# Patient Record
Sex: Male | Born: 1965 | Race: White | Hispanic: No | Marital: Married | Smoking: Former smoker
Health system: Southern US, Community
[De-identification: ages and names within clinical notes are randomized; demographics above are authoritative.]

## PROBLEM LIST (undated history)

## (undated) DIAGNOSIS — Z87442 Personal history of urinary calculi: Secondary | ICD-10-CM

## (undated) DIAGNOSIS — N200 Calculus of kidney: Secondary | ICD-10-CM

## (undated) DIAGNOSIS — J189 Pneumonia, unspecified organism: Secondary | ICD-10-CM

## (undated) DIAGNOSIS — I4891 Unspecified atrial fibrillation: Secondary | ICD-10-CM

## (undated) DIAGNOSIS — I499 Cardiac arrhythmia, unspecified: Secondary | ICD-10-CM

## (undated) DIAGNOSIS — G43909 Migraine, unspecified, not intractable, without status migrainosus: Secondary | ICD-10-CM

## (undated) DIAGNOSIS — I10 Essential (primary) hypertension: Secondary | ICD-10-CM

## (undated) DIAGNOSIS — I482 Chronic atrial fibrillation, unspecified: Secondary | ICD-10-CM

## (undated) DIAGNOSIS — Q249 Congenital malformation of heart, unspecified: Secondary | ICD-10-CM

## (undated) DIAGNOSIS — M1990 Unspecified osteoarthritis, unspecified site: Secondary | ICD-10-CM

## (undated) DIAGNOSIS — Z973 Presence of spectacles and contact lenses: Secondary | ICD-10-CM

## (undated) DIAGNOSIS — N3289 Other specified disorders of bladder: Secondary | ICD-10-CM

## (undated) HISTORY — DX: Congenital malformation of heart, unspecified: Q24.9

## (undated) HISTORY — DX: Unspecified atrial fibrillation: I48.91

## (undated) HISTORY — DX: Essential (primary) hypertension: I10

## (undated) HISTORY — DX: Chronic atrial fibrillation, unspecified: I48.20

## (undated) HISTORY — PX: TRANSTHORACIC ECHOCARDIOGRAM: SHX275

## (undated) HISTORY — DX: Migraine, unspecified, not intractable, without status migrainosus: G43.909

## (undated) HISTORY — DX: Calculus of kidney: N20.0

## (undated) HISTORY — PX: WISDOM TOOTH EXTRACTION: SHX21

## (undated) HISTORY — PX: CARDIAC SURGERY: SHX584

## (undated) HISTORY — DX: Other specified disorders of bladder: N32.89

---

## 2017-10-19 DIAGNOSIS — J189 Pneumonia, unspecified organism: Secondary | ICD-10-CM

## 2017-10-19 DIAGNOSIS — Z8701 Personal history of pneumonia (recurrent): Secondary | ICD-10-CM

## 2017-10-19 HISTORY — DX: Personal history of pneumonia (recurrent): Z87.01

## 2017-10-19 HISTORY — DX: Pneumonia, unspecified organism: J18.9

## 2018-03-19 HISTORY — PX: CARDIOVERSION: SHX1299

## 2018-09-16 DIAGNOSIS — R030 Elevated blood-pressure reading, without diagnosis of hypertension: Secondary | ICD-10-CM | POA: Diagnosis not present

## 2018-09-16 DIAGNOSIS — I4891 Unspecified atrial fibrillation: Secondary | ICD-10-CM | POA: Diagnosis not present

## 2018-09-16 DIAGNOSIS — Z760 Encounter for issue of repeat prescription: Secondary | ICD-10-CM | POA: Diagnosis not present

## 2018-09-18 HISTORY — PX: TRANSTHORACIC ECHOCARDIOGRAM: SHX275

## 2018-09-23 ENCOUNTER — Encounter

## 2018-09-23 ENCOUNTER — Ambulatory Visit: Payer: 59 | Admitting: Family Medicine

## 2018-09-23 ENCOUNTER — Encounter: Payer: Self-pay | Admitting: Family Medicine

## 2018-09-23 VITALS — BP 139/94 | HR 76 | Temp 98.1°F | Resp 16 | Ht 75.5 in | Wt 199.2 lb

## 2018-09-23 DIAGNOSIS — I4819 Other persistent atrial fibrillation: Secondary | ICD-10-CM

## 2018-09-23 MED ORDER — BISOPROLOL FUMARATE 10 MG PO TABS: 10.0000 mg | ORAL_TABLET | Freq: Every day | ORAL | 2 refills | Status: DC

## 2018-09-23 NOTE — Progress Notes (Signed)
Office Note 09/23/2018  CC:  Chief Complaint  Patient presents with  . Establish Care    Previous PCP: None in [place], [place] 3 months ago  . Atrial Fibrillation    wants referral to cardiologist    HPI:  Robert Braun is a 52 y.o. male who is here to establish care and discuss relatively recent dx of A-fib. Patient's most recent primary MD: in [place]. Old records were not reviewed prior to or during today's visit.  Dx'd with a-fib 11/2017 in the context of a pneumonia illness.   He had DC cardioversion 03/2018 but this did not hold.  He has some intermittent perception of irregular heartbeat.  No dizziness, no SOB, no CP. Symptoms seem to occur randomly and only for brief periods. He is not exercising much lately. No melena, hematochezia, nosebleeds, or excessive bleeding. He knows to avoid ASA/NSAIDs.  Past Medical History:  Diagnosis Date  . Atrial fibrillation ([place])   . Migraines     Past Surgical History:  Procedure Laterality Date  . CARDIOVERSION  03/2018    Family History  Problem Relation Age of Onset  . Parkinson's disease Mother   . Hypertension Father     Social History   Socioeconomic History  . Marital status: Married    Spouse name: Not on file  . Number of children: Not on file  . Years of education: Not on file  . Highest education level: Not on file  Occupational History  . Not on file  Social Needs  . Financial resource strain: Not on file  . Food insecurity:    Worry: Not on file    Inability: Not on file  . Transportation needs:    Medical: Not on file    Non-medical: Not on file  Tobacco Use  . Smoking status: Former Smoker    Types: Cigarettes    Last attempt to quit: 11/19/2013    Years since quitting: 4.8  . Smokeless tobacco: Never Used  Substance and Sexual Activity  . Alcohol use: Yes  . Drug use: Never  . Sexual activity: Not on file  Lifestyle  . Physical activity:    Days per week: Not on file   Minutes per session: Not on file  . Stress: Not on file  Relationships  . Social connections:    Talks on phone: Not on file    Gets together: Not on file    Attends religious service: Not on file    Active member of club or organization: Not on file    Attends meetings of clubs or organizations: Not on file    Relationship status: Not on file  . Intimate partner violence:    Fear of current or ex partner: Not on file    Emotionally abused: Not on file    Physically abused: Not on file    Forced sexual activity: Not on file  Other Topics Concern  . Not on file  Social History Narrative   Married, 1 daughter.   Orig from [place].   Lived in [place] 20 yrs, then moved to [facility] 04/2018.   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   Smoker until 2015-->switched to vap.   Alcohol: some beer and wine several days per week--sometimes >3 glasses of wine.   No drugs.    Outpatient Encounter Medications as of 09/23/2018  Medication Sig  . ELIQUIS 5 MG TABS tablet Take 1 tablet by mouth 2 (  two) times daily.  . Multiple Vitamin (MULTIVITAMIN) tablet Take 1 tablet by mouth daily.  . [DISCONTINUED] bisoprolol (ZEBETA) 5 MG tablet Take 5 mg by mouth daily.  . bisoprolol (ZEBETA) 10 MG tablet Take 1 tablet (10 mg total) by mouth daily.   No facility-administered encounter medications on file as of 09/23/2018.     No Known Allergies  ROS Review of Systems  Constitutional: Negative for appetite change, chills, fatigue and fever.  HENT: Negative for congestion, dental problem, ear pain and sore throat.   Eyes: Negative for discharge, redness and visual disturbance.  Respiratory: Negative for cough, chest tightness, shortness of breath and wheezing.   Cardiovascular: Positive for palpitations (occasional-->see hpi). Negative for chest pain and leg swelling.  Gastrointestinal: Negative for abdominal pain, blood in stool, diarrhea, nausea and vomiting.  Genitourinary: Negative for difficulty  urinating, dysuria, flank pain, frequency, hematuria and urgency.  Musculoskeletal: Negative for arthralgias, back pain, joint swelling, myalgias and neck stiffness.  Skin: Negative for pallor and rash.  Neurological: Negative for dizziness, speech difficulty, weakness and headaches.  Hematological: Negative for adenopathy. Does not bruise/bleed easily.  Psychiatric/Behavioral: Negative for confusion and sleep disturbance. The patient is not nervous/anxious.     PE; Blood pressure (!) 139/94, pulse 76, temperature 98.1 F (36.7 C), temperature source Oral, resp. rate 16, height 6' 3.5" (1.918 m), weight 199 lb 4 oz (90.4 kg), SpO2 99 %. Gen: Alert, well appearing.  Patient is oriented to person, place, time, and situation. AFFECT: pleasant, lucid thought and speech. OTL:XBWI: no injection, icteris, swelling, or exudate.  EOMI, PERRLA. Mouth: lips without lesion/swelling.  Oral mucosa pink and moist. Oropharynx without erythema, exudate, or swelling.  CV: irreg irreg, rate approx 203-559, with 2/6 systolic murmur heard best at apex, without rub.   LUNGS: CTA bilat, nonlabored resps, good aeration in all lung fields. EXT: no clubbing or cyanosis.  no edema.   Pertinent labs:  none  ASSESSMENT AND PLAN:   New pt: no old records at this time.  A-fib, persistent.  Currently has mild tachycardia, mild bp elevation, and is intermittently with mild/brief palpitations/racing heart sensation. Increase bisoprolol to 10mg  qd. Continue eliquis 5mg  bid. Referral to cardiologist ordered. Pt states that he has some records from his cardiologist in the [place] and he'll find them and let us get a copy and he'll also bring a copy to the cardiologist.  An After Visit Summary was printed and given to the patient.  Return in about 6 months (around 03/25/2019) for annual CPE (fasting).  Signed:  Crissie Sickles, MD           09/23/2018

## 2018-09-30 ENCOUNTER — Ambulatory Visit: Payer: 59 | Admitting: Cardiology

## 2018-10-04 ENCOUNTER — Encounter: Payer: Self-pay | Admitting: Cardiovascular Disease

## 2018-10-04 ENCOUNTER — Ambulatory Visit: Payer: 59 | Admitting: Cardiovascular Disease

## 2018-10-04 VITALS — BP 130/85 | HR 105 | Ht 75.59 in | Wt 202.0 lb

## 2018-10-04 DIAGNOSIS — Q249 Congenital malformation of heart, unspecified: Secondary | ICD-10-CM | POA: Insufficient documentation

## 2018-10-04 DIAGNOSIS — I4819 Other persistent atrial fibrillation: Secondary | ICD-10-CM

## 2018-10-04 NOTE — Assessment & Plan Note (Signed)
Robert Braun was referred to me by Dr. Ernestine Conrad to be established in my practice and for consultation regarding atrial fibrillation.  He was diagnosed with A. fib after bronchitis in January of this year.  He underwent successful cardioversion in June and went back in A. fib on 05/08/2018.  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  He is on Eliquis oral anticoagulation and Zebeta for rate control.  He does admit to alcohol utilization several days a week.  I am going to refer him to the A. fib clinic for further evaluation.  He may benefit from repeat cardioversion and/or consideration of A. fib ablation which may be complicated by his Congenital heart disease.

## 2018-10-04 NOTE — Patient Instructions (Signed)
Medication Instructions:  NONE If you need a refill on your cardiac medications before your next appointment, please call your pharmacy.   Lab work: NONE If you have labs (blood work) drawn today and your tests are completely normal, you will receive your results only by: Marland Kitchen MyChart Message (if you have MyChart) OR . A paper copy in the mail If you have any lab test that is abnormal or we need to change your treatment, we will call you to review the results.  Testing/Procedures: Your physician has requested that you have an echocardiogram. Echocardiography is a painless test that uses sound waves to create images of your heart. It provides your doctor with information about the size and shape of your heart and how well your heart's chambers and valves are working. This procedure takes approximately one hour. There are no restrictions for this procedure.   Follow-Up: At [facility], you and your health needs are our priority.  As part of our continuing mission to provide you with exceptional heart care, we have created designated Provider Care Teams.  These Care Teams include your primary Cardiologist (physician) and Advanced Practice Providers (APPs -  Physician Assistants and Nurse Practitioners) who all work together to provide you with the care you need, when you need it. You will need a follow up appointment in 6 months.  Please call our office 2 months in advance to schedule this appointment.  You may see DR. BERRY or one of the following Advanced Practice Providers on your designated Care Team:   Kerin Ransom, PA-C [place], [place] . Sande Rives, PA-C  Any Other Special Instructions Will Be Listed Below (If Applicable). REFERRAL TO [facility]

## 2018-10-04 NOTE — Progress Notes (Signed)
10/04/2018 Robert Braun   Jan 02, 1966  [id number]  Primary Physician McGowen, Adrian Blackwater, MD Primary Cardiologist: Lorretta Harp MD Lupe Carney, [place]  HPI:  Robert Braun is a 52 y.o. fit appearing married Caucasian male father of 1 daughter who is referred by Dr. Ernestine Conrad for cardiovascular evaluation because of persistent atrial fibrillation.  He recently relocated from the [place] to [facility] 4 months ago because of his wife's job.  She is the Biomedical engineer at [employer].  His cardiac risk factor profile is notable for 25-pack-year tobacco abuse having quit 5 years ago.  Otherwise he has no family history.  He is never had a heart attack or stroke.  Denies chest pain or shortness of breath.  He was diagnosed with A. fib after a bout of bronchitis in January of this year.  He was cardioverted in June successfully went back in A. fib on 05/08/2018.  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  He does feel that he is in A. fib but is otherwise asymptomatic from this.   Current Meds  Medication Sig  . bisoprolol (ZEBETA) 10 MG tablet Take 1 tablet (10 mg total) by mouth daily.  Marland Kitchen ELIQUIS 5 MG TABS tablet Take 1 tablet by mouth 2 (two) times daily.  . Multiple Vitamin (MULTIVITAMIN) tablet Take 1 tablet by mouth daily.     No Known Allergies  Social History   Socioeconomic History  . Marital status: Married    Spouse name: Not on file  . Number of children: Not on file  . Years of education: Not on file  . Highest education level: Not on file  Occupational History  . Not on file  Social Needs  . Financial resource strain: Not on file  . Food insecurity:    Worry: Not on file    Inability: Not on file  . Transportation needs:    Medical: Not on file    Non-medical: Not on file  Tobacco Use  .  Smoking status: Former Smoker    Types: Cigarettes    Last attempt to quit: 11/19/2013    Years since quitting: 4.8  . Smokeless tobacco: Never Used  Substance and Sexual Activity  . Alcohol use: Yes  . Drug use: Never  . Sexual activity: Not on file  Lifestyle  . Physical activity:    Days per week: Not on file    Minutes per session: Not on file  . Stress: Not on file  Relationships  . Social connections:    Talks on phone: Not on file    Gets together: Not on file    Attends religious service: Not on file    Active member of club or organization: Not on file    Attends meetings of clubs or organizations: Not on file    Relationship status: Not on file  . Intimate partner violence:    Fear of current or ex partner: Not on file    Emotionally abused: Not on file    Physically abused: Not on file    Forced sexual activity: Not on file  Other Topics Concern  . Not on file  Social History Narrative   Married, 1 daughter.   Orig from [place].   Lived in [place] 20 yrs, then moved to [facility] 04/2018.   Occup: Homemaker.  Wife is  VP of a company.   Smoker until 2015-->switched to vap.   Alcohol: some beer and wine several days per week--sometimes >3 glasses of wine.   No drugs.     Review of Systems: General: negative for chills, fever, night sweats or weight changes.  Cardiovascular: negative for chest pain, dyspnea on exertion, edema, orthopnea, palpitations, paroxysmal nocturnal dyspnea or shortness of breath Dermatological: negative for rash Respiratory: negative for cough or wheezing Urologic: negative for hematuria Abdominal: negative for nausea, vomiting, diarrhea, bright red blood per rectum, melena, or hematemesis Neurologic: negative for visual changes, syncope, or dizziness All other systems reviewed and are otherwise negative except as noted above.    Blood pressure 130/85, pulse (!) 105, height 6' 3.59" (1.92 m), weight 202 lb (91.6 kg).  General appearance:  alert and no distress Neck: no adenopathy, no carotid bruit, no JVD, supple, symmetrical, trachea midline and thyroid not enlarged, symmetric, no tenderness/mass/nodules Lungs: clear to auscultation bilaterally Heart: regular rate and rhythm, S1, S2 normal, no murmur, click, rub or gallop Extremities: extremities normal, atraumatic, no cyanosis or edema Pulses: 2+ and symmetric Skin: Skin color, texture, turgor normal. No rashes or lesions Neurologic: Alert and oriented X 3, normal strength and tone. Normal symmetric reflexes. Normal coordination and gait  EKG atrial fibrillation with nonspecific IVCD, rapid ventricular response and lateral T wave inversion.  I personally reviewed this EKG.  ASSESSMENT AND PLAN:   Persistent atrial fibrillation Mr Linquist was referred to me by Dr. Ernestine Conrad to be established in my practice and for consultation regarding atrial fibrillation.  He was diagnosed with A. fib after bronchitis in January of this year.  He underwent successful cardioversion in June and went back in A. fib on 05/08/2018.  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  He is on Eliquis oral anticoagulation and Zebeta for rate control.  He does admit to alcohol utilization several days a week.  I am going to refer him to the A. fib clinic for further evaluation.  He may benefit from repeat cardioversion and/or consideration of A. fib ablation which may be complicated by his Congenital heart disease.      Lorretta Harp MD FACP,FACC,FAHA, [facility] 10/04/2018 11:18 AM

## 2018-10-09 ENCOUNTER — Encounter: Payer: Self-pay | Admitting: Family Medicine

## 2018-10-10 ENCOUNTER — Telehealth: Payer: Self-pay | Admitting: Family Medicine

## 2018-10-10 NOTE — Telephone Encounter (Signed)
Patient requesting refills Bisoprolol Fumarate and Eliquis to be [facility].

## 2018-10-10 NOTE — Telephone Encounter (Signed)
Rx for bisoprolol was sent on 09/23/18 for #30 w/ 2RF.   We did not send Rx for Eliquis, new pt to office.   Please advise. Thanks.

## 2018-10-11 ENCOUNTER — Ambulatory Visit (HOSPITAL_COMMUNITY): Payer: 59 | Attending: Cardiovascular Disease

## 2018-10-11 DIAGNOSIS — I4819 Other persistent atrial fibrillation: Secondary | ICD-10-CM | POA: Diagnosis not present

## 2018-10-12 ENCOUNTER — Encounter: Payer: Self-pay | Admitting: Family Medicine

## 2018-10-12 MED ORDER — ELIQUIS 5 MG PO TABS: 5.0000 mg | ORAL_TABLET | Freq: Two times a day (BID) | ORAL | 1 refills | Status: DC

## 2018-10-12 NOTE — Telephone Encounter (Signed)
Eliquis eRx'd.

## 2018-10-18 ENCOUNTER — Ambulatory Visit: Payer: 59 | Admitting: Family Medicine

## 2018-10-18 ENCOUNTER — Ambulatory Visit (INDEPENDENT_AMBULATORY_CARE_PROVIDER_SITE_OTHER): Payer: 59

## 2018-10-18 ENCOUNTER — Encounter: Payer: Self-pay | Admitting: Family Medicine

## 2018-10-18 ENCOUNTER — Other Ambulatory Visit (INDEPENDENT_AMBULATORY_CARE_PROVIDER_SITE_OTHER): Payer: 59

## 2018-10-18 VITALS — BP 134/79 | HR 94 | Temp 97.5°F | Resp 16 | Ht 75.59 in | Wt 196.4 lb

## 2018-10-18 DIAGNOSIS — R1031 Right lower quadrant pain: Secondary | ICD-10-CM

## 2018-10-18 DIAGNOSIS — M545 Low back pain, unspecified: Secondary | ICD-10-CM

## 2018-10-18 DIAGNOSIS — R1030 Lower abdominal pain, unspecified: Secondary | ICD-10-CM | POA: Diagnosis not present

## 2018-10-18 DIAGNOSIS — R5383 Other fatigue: Secondary | ICD-10-CM | POA: Diagnosis not present

## 2018-10-18 DIAGNOSIS — R110 Nausea: Secondary | ICD-10-CM | POA: Diagnosis not present

## 2018-10-18 LAB — POCT URINALYSIS DIPSTICK
Bilirubin, UA: NEGATIVE
Blood, UA: NEGATIVE
Glucose, UA: NEGATIVE
Ketones, UA: NEGATIVE
LEUKOCYTES UA: NEGATIVE
NITRITE UA: NEGATIVE
PROTEIN UA: NEGATIVE
SPEC GRAV UA: 1.01 (ref 1.010–1.025)
Urobilinogen, UA: 0.2 E.U./dL
pH, UA: 6 (ref 5.0–8.0)

## 2018-10-18 LAB — CBC WITH DIFFERENTIAL/PLATELET
Basophils Absolute: 0.1 10*3/uL (ref 0.0–0.1)
Basophils Relative: 1 % (ref 0.0–3.0)
EOS PCT: 3.9 % (ref 0.0–5.0)
Eosinophils Absolute: 0.2 10*3/uL (ref 0.0–0.7)
HCT: 43.6 % (ref 39.0–52.0)
Hemoglobin: 14.5 g/dL (ref 13.0–17.0)
Lymphocytes Relative: 37.3 % (ref 12.0–46.0)
Lymphs Abs: 2.3 10*3/uL (ref 0.7–4.0)
MCHC: 33.2 g/dL (ref 30.0–36.0)
MCV: 96.3 fl (ref 78.0–100.0)
MONO ABS: 0.8 10*3/uL (ref 0.1–1.0)
Monocytes Relative: 12.4 % — ABNORMAL HIGH (ref 3.0–12.0)
Neutro Abs: 2.8 10*3/uL (ref 1.4–7.7)
Neutrophils Relative %: 45.4 % (ref 43.0–77.0)
Platelets: 210 10*3/uL (ref 150.0–400.0)
RBC: 4.52 Mil/uL (ref 4.22–5.81)
RDW: 13.3 % (ref 11.5–15.5)
WBC: 6.2 10*3/uL (ref 4.0–10.5)

## 2018-10-18 LAB — COMPREHENSIVE METABOLIC PANEL
ALT: 17 U/L (ref 0–53)
AST: 11 U/L (ref 0–37)
Albumin: 4.3 g/dL (ref 3.5–5.2)
Alkaline Phosphatase: 33 U/L — ABNORMAL LOW (ref 39–117)
BUN: 12 mg/dL (ref 6–23)
CO2: 30 mEq/L (ref 19–32)
Calcium: 9.6 mg/dL (ref 8.4–10.5)
Chloride: 102 mEq/L (ref 96–112)
Creatinine, Ser: 0.88 mg/dL (ref 0.40–1.50)
GFR: 96.76 mL/min (ref >60.00)
Glucose, Bld: 93 mg/dL (ref 70–99)
Potassium: 4.9 mEq/L (ref 3.5–5.1)
Sodium: 138 mEq/L (ref 135–145)
Total Bilirubin: 0.5 mg/dL (ref 0.2–1.2)
Total Protein: 6.1 g/dL (ref 6.0–8.3)

## 2018-10-18 LAB — TSH: TSH: 0.7 u[IU]/mL (ref 0.35–4.50)

## 2018-10-18 LAB — LIPASE: Lipase: 35 U/L (ref 11.0–59.0)

## 2018-10-18 LAB — SEDIMENTATION RATE: Sed Rate: 1 mm/hr (ref 0–20)

## 2018-10-18 NOTE — Patient Instructions (Signed)
Low Back Sprain Rehab  Ask your health care provider which exercises are safe for you. Do exercises exactly as told by your health care provider and adjust them as directed. It is normal to feel mild stretching, pulling, tightness, or discomfort as you do these exercises, but you should stop right away if you feel sudden pain or your pain gets worse. Do not begin these exercises until told by your health care provider.  Stretching and range of motion exercises  These exercises warm up your muscles and joints and improve the movement and flexibility of your back. These exercises also help to relieve pain, numbness, and tingling.  Exercise A: Lumbar rotation    1. Lie on your back on a firm surface and bend your knees.  2. Straighten your arms out to your sides so each arm forms an "L" shape with a side of your body (a 90 degree angle).  3. Slowly move both of your knees to one side of your body until you feel a stretch in your lower back. Try not to let your shoulders move off of the floor.  4. Hold for __________ seconds.  5. Tense your abdominal muscles and slowly move your knees back to the starting position.  6. Repeat this exercise on the other side of your body.  Repeat __________ times. Complete this exercise __________ times a day.  Exercise B: Prone extension on elbows    1. Lie on your abdomen on a firm surface.  2. Prop yourself up on your elbows.  3. Use your arms to help lift your chest up until you feel a gentle stretch in your abdomen and your lower back.  ? This will place some of your body weight on your elbows. If this is uncomfortable, try stacking pillows under your chest.  ? Your hips should stay down, against the surface that you are lying on. Keep your hip and back muscles relaxed.  4. Hold for __________ seconds.  5. Slowly relax your upper body and return to the starting position.  Repeat __________ times. Complete this exercise __________ times a day.  Strengthening exercises  These  exercises build strength and endurance in your back. Endurance is the ability to use your muscles for a long time, even after they get tired.  Exercise C: Pelvic tilt  1. Lie on your back on a firm surface. Bend your knees and keep your feet flat.  2. Tense your abdominal muscles. Tip your pelvis up toward the ceiling and flatten your lower back into the floor.  ? To help with this exercise, you may place a small towel under your lower back and try to push your back into the towel.  3. Hold for __________ seconds.  4. Let your muscles relax completely before you repeat this exercise.  Repeat __________ times. Complete this exercise __________ times a day.  Exercise D: Alternating arm and leg raises    1. Get on your hands and knees on a firm surface. If you are on a hard floor, you may want to use padding to cushion your knees, such as an exercise mat.  2. Line up your arms and legs. Your hands should be below your shoulders, and your knees should be below your hips.  3. Lift your left leg behind you. At the same time, raise your right arm and straighten it in front of you.  ? Do not lift your leg higher than your hip.  ? Do not lift your arm   higher than your shoulder.  ? Keep your abdominal and back muscles tight.  ? Keep your hips facing the ground.  ? Do not arch your back.  ? Keep your balance carefully, and do not hold your breath.  4. Hold for __________ seconds.  5. Slowly return to the starting position and repeat with your right leg and your left arm.  Repeat __________ times. Complete this exercise __________ times a day.  Exercise E: Abdominal set with straight leg raise    1. Lie on your back on a firm surface.  2. Bend one of your knees and keep your other leg straight.  3. Tense your abdominal muscles and lift your straight leg up, 4-6 inches (10-15 cm) off the ground.  4. Keep your abdominal muscles tight and hold for __________ seconds.  ? Do not hold your breath.  ? Do not arch your back. Keep it  flat against the ground.  5. Keep your abdominal muscles tense as you slowly lower your leg back to the starting position.  6. Repeat with your other leg.  Repeat __________ times. Complete this exercise __________ times a day.  Posture and body mechanics    Body mechanics refers to the movements and positions of your body while you do your daily activities. Posture is part of body mechanics. Good posture and healthy body mechanics can help to relieve stress in your body's tissues and joints. Good posture means that your spine is in its natural S-curve position (your spine is neutral), your shoulders are pulled back slightly, and your head is not tipped forward. The following are general guidelines for applying improved posture and body mechanics to your everyday activities.  Standing    · When standing, keep your spine neutral and your feet about hip-width apart. Keep a slight bend in your knees. Your ears, shoulders, and hips should line up.  · When you do a task in which you stand in one place for a long time, place one foot up on a stable object that is 2-4 inches (5-10 cm) high, such as a footstool. This helps keep your spine neutral.  Sitting    · When sitting, keep your spine neutral and keep your feet flat on the floor. Use a footrest, if necessary, and keep your thighs parallel to the floor. Avoid rounding your shoulders, and avoid tilting your head forward.  · When working at a desk or a computer, keep your desk at a height where your hands are slightly lower than your elbows. Slide your chair under your desk so you are close enough to maintain good posture.  · When working at a computer, place your monitor at a height where you are looking straight ahead and you do not have to tilt your head forward or downward to look at the screen.  Resting    · When lying down and resting, avoid positions that are most painful for you.  · If you have pain with activities such as sitting, bending, stooping, or squatting  (flexion-based activities), lie in a position in which your body does not bend very much. For example, avoid curling up on your side with your arms and knees near your chest (fetal position).  · If you have pain with activities such as standing for a long time or reaching with your arms (extension-based activities), lie with your spine in a neutral position and bend your knees slightly. Try the following positions:  · Lying on your side with a   pillow between your knees.  · Lying on your back with a pillow under your knees.  Lifting    · When lifting objects, keep your feet at least shoulder-width apart and tighten your abdominal muscles.  · Bend your knees and hips and keep your spine neutral. It is important to lift using the strength of your legs, not your back. Do not lock your knees straight out.  · Always ask for help to lift heavy or awkward objects.  This information is not intended to replace advice given to you by your health care provider. Make sure you discuss any questions you have with your health care provider.  Document Released: 10/05/2005 Document Revised: 06/11/2016 Document Reviewed: 07/17/2015  [employer]vier Interactive Patient Education © 2019 [employer]

## 2018-10-18 NOTE — Progress Notes (Signed)
OFFICE VISIT  10/18/2018   CC:  Chief Complaint  Patient presents with  . Back Pain    lower back pain, also felt like he had low grade fever  . Abdominal Pain    lower   HPI:    Patient is a 52 y.o. Caucasian male who presents for low back pain. Onset 2 mo ago, intermittent LBP bilat, about every other day, varies a lot in intensity but never severe.  No radiation of the pain.  Pain is not altered by position but then he says he feels like it is more stiff when bending forward and back up again.  No correlation with excessive activity in the prior day or two.  No hx of preceding injury.  No hx of malignancy or immunosuppression. He takes no med for the pain.  Generally doesn't feel well during these times, ? Feeling feverish-->pt vague, admits he is unable to be very clear in his description of how he feels.   Also, at separate times (not same time as back) he has some burning/sharp pain in R LQ and rad across lower abd and into R groin.  No urine c/o.  No worsening or relief of the pain with urinating.  No gross hematuria, urinary urgency or incomplete emptying.  Slight nauseated feeling but no vomiting.  No diarrhea or constipation.   No clear evidence of abnl wt loss.  Appetite has been a little bit down but says he is taking in about the same amount of calories as normal.    Past Medical History:  Diagnosis Date  . Atrial fibrillation ([place])    09/2018: Dr. Gwenlyn Found referred him to a-fib clinic for consideration or redo CV or ablation.  . 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  Marland Kitchen Migraines     Past Surgical History:  Procedure Laterality Date  . CARDIAC SURGERY     correction of transposition of the great arteries noted on cardiac MRI done in the [place].  Marland Kitchen CARDIOVERSION  03/2018   pt only briefly held sinus rhythm  . TRANSTHORACIC [place]CHOCARDIOGRAM     [place]-->[place] any results.  . TRANSTHORACIC ECHOCARDIOGRAM  09/2018   EF for both ventricles 25%, severe systemic AV (tricuspid) regurg--> pt needs gated CT angio and [facility].    Outpatient Medications Prior to Visit  Medication Sig Dispense Refill  . bisoprolol (ZEBETA) 10 MG tablet Take 1 tablet (10 mg total) by mouth daily. 30 tablet 2  . ELIQUIS 5 MG TABS tablet Take 1 tablet (5 mg total) by mouth 2 (two) times daily. 180 tablet 1  . Multiple Vitamin (MULTIVITAMIN) tablet Take 1 tablet by mouth daily.     No facility-administered medications prior to visit.     No Known Allergies  ROS As per HPI  PE: Blood pressure 134/79, pulse 94, temperature (!) 97.5 F (36.4 C), temperature source Oral, resp. rate 16, height 6' 3.59" (1.92 m), weight 196 lb 6 oz (89.1 kg), SpO2 100 %. Gen: Alert, well appearing.  Patient is oriented to person, place, time, and situation. AFFECT: pleasant, lucid thought and speech.  He is mildly anxious. Cervical, thoracic and lumbar spine exam is normal without tenderness, masses or kyphoscoliosis. Full range of motion without pain is noted.  Some stiffness noted by pt when going from full flexion of L spine  to upright position.  Supine SLR neg bilat.   CV: RRR, no m/r/g.   LUNGS: CTA bilat, nonlabored resps, good aeration in all lung fields. ABD: soft, NT, ND, BS normal.  No hepatospenomegaly or mass.  No bruits. EXT: no clubbing or cyanosis.  no edema.     LABS:    Chemistry      Component Value Date/Time   NA 138 10/18/2018 0959   K 4.9 10/18/2018 0959   CL 102 10/18/2018 0959   CO2 30 10/18/2018 0959   BUN 12 10/18/2018 0959   CREATININE 0.88 10/18/2018 0959      Component Value Date/Time   CALCIUM 9.6 10/18/2018 0959   ALKPHOS 33 (L) 10/18/2018 0959   AST 11 10/18/2018 0959   ALT 17 10/18/2018 0959   BILITOT 0.5 10/18/2018 0959     Lab Results  Component Value Date   WBC 6.2 10/18/2018   HGB 14.5  10/18/2018   HCT 43.6 10/18/2018   MCV 96.3 10/18/2018   PLT 210.0 10/18/2018   Lab Results  Component Value Date   TSH 0.70 10/18/2018    IMPRESSION AND PLAN:  1) Low back pain; musculoskeletal suspected. With pt's poor ability to describe sx's combined with his significant anxiety surrounding the way he feels, I will check plain film of L/S spine.  2) Intermittent RLQ pain, rad to R groin.  Mild nonspecific fatigue x ?months. Normal abd exam.  He drank at least 5 bottles of water (> 1 hr) and he still could not produce a urine sample for analysis here today.  We sent him to [facility] [place] office to get his x-ray and the staff there said they would take care of getting a urine specimen as well. Will check CBC w/diff, CMET, lipase, ESR, TSH, and hemoccults x 3 as well.  An After Visit Summary was printed and given to the patient.  FOLLOW UP: Return in about 2 weeks (around 11/01/2018) for f/u LBP and RLQ abd pains.  Signed:  Crissie Sickles, MD           10/18/2018

## 2018-10-19 DIAGNOSIS — R109 Unspecified abdominal pain: Secondary | ICD-10-CM

## 2018-10-19 HISTORY — DX: Unspecified abdominal pain: R10.9

## 2018-10-19 LAB — URINE CULTURE
MICRO NUMBER: 91556944
Result:: NO GROWTH
SPECIMEN QUALITY:: ADEQUATE

## 2018-10-20 ENCOUNTER — Encounter: Payer: Self-pay | Admitting: *Deleted

## 2018-10-26 ENCOUNTER — Ambulatory Visit (HOSPITAL_COMMUNITY)
Admission: RE | Admit: 2018-10-26 | Discharge: 2018-10-26 | Disposition: A | Discharge status: Home / Self Care | Payer: 59 | Source: Ambulatory Visit | Attending: Nurse Practitioner | Admitting: Nurse Practitioner

## 2018-10-26 ENCOUNTER — Encounter (HOSPITAL_COMMUNITY): Payer: Self-pay | Admitting: Nurse Practitioner

## 2018-10-26 VITALS — BP 144/86 | HR 127 | Ht 75.59 in | Wt 196.0 lb

## 2018-10-26 DIAGNOSIS — Z8701 Personal history of pneumonia (recurrent): Secondary | ICD-10-CM | POA: Insufficient documentation

## 2018-10-26 DIAGNOSIS — I4819 Other persistent atrial fibrillation: Secondary | ICD-10-CM

## 2018-10-26 DIAGNOSIS — Z7901 Long term (current) use of anticoagulants: Secondary | ICD-10-CM | POA: Diagnosis not present

## 2018-10-26 DIAGNOSIS — Z79899 Other long term (current) drug therapy: Secondary | ICD-10-CM | POA: Diagnosis not present

## 2018-10-26 DIAGNOSIS — Z8249 Family history of ischemic heart disease and other diseases of the circulatory system: Secondary | ICD-10-CM | POA: Insufficient documentation

## 2018-10-26 DIAGNOSIS — Q249 Congenital malformation of heart, unspecified: Secondary | ICD-10-CM | POA: Diagnosis not present

## 2018-10-26 DIAGNOSIS — Z87891 Personal history of nicotine dependence: Secondary | ICD-10-CM | POA: Diagnosis not present

## 2018-10-27 NOTE — Progress Notes (Addendum)
Primary Care Physician: Tammi Sou, MD Referring Physician: Dr. Gwenlyn Found Cardiologist: Dr. [name] is a 53 y.o. male with a h/o persistent afib, transposition of the great arteries(  discovered  2019 in the [place]), recently moved here form the [place], as his wife was given a job as Mudlogger of IT  with [employer]. He is [place] form [place].  He gives a story of developing Pneumonia in JAN-FEB of 2019. At that time new onset afib was noted. He was started on anticoagulation and had a cardioversion in June which was successful. It  held one month. He was then seen by a general  cardiologist and an Adult Congential Heart Disease specialist in the [place].. They both suggested restoring sinus rhythm, the cardiologist stated that he would probably require amiodarone to maintain SR as he has severely dilated left atrium. The congential specialist wanted additional testing when SR was restored. Their plans were limited with pt planning to move to the [place]. He did arrive in the fall but it took time to establish with a PCP and then referral to cardiology and then to the afib clinic.He is in afib with rvr, v rates in the 120's and is tolerating well. Has a remote history of tobacco abuse/ more recently vaping and alcohol use. 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   Today, he denies symptoms of palpitations, chest pain, shortness of breath, orthopnea, PND, lower extremity edema, dizziness, presyncope, syncope, or neurologic sequela. The patient is tolerating medications without difficulties and is otherwise without complaint today.   Past Medical History:  Diagnosis Date  . Atrial fibrillation ([place])    09/2018: Dr. Gwenlyn Found referred him to a-fib clinic for consideration or redo CV or ablation.  . 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  Marland Kitchen Migraines    Past Surgical History:  Procedure  Laterality Date  . CARDIAC SURGERY     correction of transposition of the great arteries noted on cardiac MRI done in the [place].  Marland Kitchen CARDIOVERSION  03/2018   pt only briefly held sinus rhythm  . TRANSTHORACIC ECHOCARDIOGRAM     [place]-->pt cannot recall any results.  . TRANSTHORACIC ECHOCARDIOGRAM  09/2018   EF for both ventricles 25%, severe systemic AV (tricuspid) regurg--> pt needs gated CT angio and cardiac [facility].    Current Outpatient Medications  Medication Sig Dispense Refill  . bisoprolol (ZEBETA) 10 MG tablet Take 1 tablet (10 mg total) by mouth daily. 30 tablet 2  . ELIQUIS 5 MG TABS tablet Take 1 tablet (5 mg total) by mouth 2 (two) times daily. 180 tablet 1  . Multiple Vitamin (MULTIVITAMIN) tablet Take 1 tablet by mouth daily.     No current facility-administered medications for this encounter.     No Known Allergies  Social History   Socioeconomic History  . Marital status: Married    Spouse name: Not on file  . Number of children: Not on file  . Years of education: Not on file  . Highest education level: Not on file  Occupational History  . Not on file  Social Needs  . Financial resource strain: Not on file  . Food insecurity:    Worry: Not on file    Inability: Not on file  . Transportation needs:    Medical: Not on file    Non-medical: Not  on file  Tobacco Use  . Smoking status: Former Smoker    Types: Cigarettes    Last attempt to quit: 11/19/2013    Years since quitting: 4.9  . Smokeless tobacco: Never Used  Substance and Sexual Activity  . Alcohol use: Yes  . Drug use: Never  . Sexual activity: Not on file  Lifestyle  . Physical activity:    Days per week: Not on file    Minutes per session: Not on file  . Stress: Not on file  Relationships  . Social connections:    Talks on phone: Not on file    Gets together: Not on file    Attends religious service: Not on file    Active member of club or organization: Not on file     Attends meetings of clubs or organizations: Not on file    Relationship status: Not on file  . Intimate partner violence:    Fear of current or ex partner: Not on file    Emotionally abused: Not on file    Physically abused: Not on file    Forced sexual activity: Not on file  Other Topics Concern  . Not on file  Social History Narrative   Married, 1 daughter.   Orig from [place].   Lived in [place] 20 yrs, then moved to [place] 04/2018.   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   Smoker until 2015-->switched to vap.   Alcohol: some beer and wine several days per week--sometimes >3 glasses of wine.   No drugs.    Family History  Problem Relation Age of Onset  . Parkinson's disease Mother   . Hypertension Father     ROS- All systems are reviewed and negative except as per the HPI above  Physical Exam: Vitals:   10/26/18 1409  BP: (!) 144/86  Pulse: (!) 127  Weight: 88.9 kg  Height: 6' 3.59" (1.92 m)   Wt Readings from Last 3 Encounters:  10/26/18 88.9 kg  10/18/18 89.1 kg  10/04/18 91.6 kg    Labs: Lab Results  Component Value Date   NA 138 10/18/2018   K 4.9 10/18/2018   CL 102 10/18/2018   CO2 30 10/18/2018   GLUCOSE 93 10/18/2018   BUN 12 10/18/2018   CREATININE 0.88 10/18/2018   CALCIUM 9.6 10/18/2018   No results found for: INR No results found for: CHOL, HDL, LDLCALC, TRIG   GEN- The patient is well appearing, alert and oriented x 3 today.   Head- normocephalic, atraumatic Eyes-  Sclera clear, conjunctiva pink Ears- hearing intact Oropharynx- clear Neck- supple, no JVP Lymph- no cervical lymphadenopathy Lungs- Clear to ausculation bilaterally, normal work of breathing Heart- Regular rate and rhythm, soft systolic  murmurs, rubs or gallops, PMI not laterally displaced GI- soft, NT, ND, + BS Extremities- no clubbing, cyanosis, or edema MS- no significant deformity or atrophy Skin- no rash or lesion Psych- euthymic mood, full affect Neuro-  strength and sensation are intact  EKG-afib at 127 bpm, LVH, qrs int 126 ms, qtc 336 ms Pt's notes form cardiology,Adult congential Specialist, EKG, Cardaic MRI, all from [place] reviewed    Assessment and Plan: 1. 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 Rayann Heman in consideration with his congenital abnormalities if this is the best option  Discussed with pt and he will check on the price of the drug I do not think he is an ablation candidate at this time, having persisitent afib for a year and severely dilated left atrium as well as the congenital issues He may need referral on to an Adult congenital specialist  once the above afib issues are addressed He will continue with Eliquis 5 mg bid for a chadsvasc score of 1( lv dysfunction)  Addendum- 10/31/2018- Discussed with Dr. Rayann Heman and he reviewed records from  the [place] and feels he would be best for him to be evaluated by Adult Congenital Specialist, and EP at [facility]  on the same day if could be arranged, instead of trying to restore SR here. Will request appointment at [facility]. Pt made aware.   Geroge Baseman Eria Lozoya, [facility] [address] [place], [place] [address] [phone]

## 2018-10-31 ENCOUNTER — Telehealth (HOSPITAL_COMMUNITY): Payer: Self-pay | Admitting: *Deleted

## 2018-10-31 NOTE — Addendum Note (Signed)
Encounter addended by: Sherran Needs, NP on: 10/31/2018 9:49 AM  Actions taken: Clinical Note Signed

## 2018-10-31 NOTE — Telephone Encounter (Signed)
Referral placed to [facility] cardiology - adult congenital specialist and EP.

## 2018-11-06 ENCOUNTER — Encounter: Payer: Self-pay | Admitting: Family Medicine

## 2018-11-08 ENCOUNTER — Encounter (HOSPITAL_COMMUNITY): Payer: Self-pay

## 2018-11-15 ENCOUNTER — Other Ambulatory Visit: Payer: Self-pay | Admitting: *Deleted

## 2018-11-15 MED ORDER — BISOPROLOL FUMARATE 10 MG PO TABS: 10.0000 mg | ORAL_TABLET | Freq: Every day | ORAL | 1 refills | Status: DC

## 2018-11-15 NOTE — Telephone Encounter (Signed)
Received fax from pharmacy requesting 90 day supply per pts insurance.  Rx sent for #90 w/ 1RF.

## 2018-11-21 DIAGNOSIS — Z8774 Personal history of (corrected) congenital malformations of heart and circulatory system: Secondary | ICD-10-CM | POA: Insufficient documentation

## 2018-11-21 DIAGNOSIS — Z8669 Personal history of other diseases of the nervous system and sense organs: Secondary | ICD-10-CM | POA: Insufficient documentation

## 2018-11-22 DIAGNOSIS — Z8669 Personal history of other diseases of the nervous system and sense organs: Secondary | ICD-10-CM | POA: Diagnosis not present

## 2018-11-22 DIAGNOSIS — Q203 Discordant ventriculoarterial connection: Secondary | ICD-10-CM | POA: Diagnosis not present

## 2018-11-22 DIAGNOSIS — Q228 Other congenital malformations of tricuspid valve: Secondary | ICD-10-CM | POA: Insufficient documentation

## 2018-11-22 DIAGNOSIS — I4819 Other persistent atrial fibrillation: Secondary | ICD-10-CM | POA: Diagnosis not present

## 2018-11-25 DIAGNOSIS — I429 Cardiomyopathy, unspecified: Secondary | ICD-10-CM | POA: Insufficient documentation

## 2018-12-04 ENCOUNTER — Encounter: Payer: Self-pay | Admitting: Family Medicine

## 2018-12-23 ENCOUNTER — Ambulatory Visit: Payer: 59 | Admitting: Family Medicine

## 2018-12-23 ENCOUNTER — Encounter: Payer: Self-pay | Admitting: Family Medicine

## 2018-12-23 DIAGNOSIS — R1031 Right lower quadrant pain: Secondary | ICD-10-CM | POA: Diagnosis not present

## 2018-12-23 NOTE — Progress Notes (Signed)
OFFICE VISIT  12/23/2018   CC:  Chief Complaint  Patient presents with  . Follow-up    RLQ abd pain   HPI:    Patient is a 53 y.o.  male who presents for 2 mo f/u LBP and RLQ abd pains. Last visit I checked a general lab panel, did UA, and did L spine x-rays-->all of these tests were normal. No meds were rx'd at that time.   Interim Hx: His low back pain has resolved.  RLQ pain:  After I saw him last he was essentially symptom-free for about 1 month, then in the last week the RLQ abd pain has returned and has been persistent.  It is focal in mid RLQ, sometimes extending down into R groin a bit, sometimes across lower abd to midline or so.  Mild/nagging level of pain that stays the same all the time.  No nausea or change in appetite.  No known trigger and no alleviating factor.  No constipation or diarrhea.  No melena or hematochezia.  No dysuria, urinary frequency, urgency, or gross hematuria.  No urinary hesitancy or decrease in force of stream.  No testicular/scrotal pain. Denies any recent abd strain. He takes not meds for this pain.    No fever, no night sweats.  Past Medical History:  Diagnosis Date  . Atrial fibrillation ([place])    09/2018: Dr. Gwenlyn Found referred him to a-fib clinic for consideration or redo CV or ablation. After eval by a-fib clinic, he was referred to [facility] to adult congenital specialist and EP: amiodarone started, cMRI to be repeated.  . 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  Also Systemic RV EF 41%, Mild systemic AR and mild/mod systemic AV valve regurg.  Normal fxn of subpulmonic LV.  Marland Kitchen Migraines     Past Surgical History:  Procedure Laterality Date  . CARDIAC SURGERY     correction of transposition of the great arteries noted on cardiac MRI done in the [place].  Marland Kitchen CARDIOVERSION  03/2018   pt only briefly held sinus rhythm  .  TRANSTHORACIC [place]OCARDIOGRAM     [place] any results.  . TRANSTHORACIC ECHOCARDIOGRAM  09/2018   EF for both ventricles 25%, severe systemic AV (tricuspid) regurg--> pt needs gated CT angio and cardiac MRI [facility].    Outpatient Medications Prior to Visit  Medication Sig Dispense Refill  . amiodarone (PACERONE) 400 MG tablet TAKE 1 TABLET BY MOUTH TWICE DAILY FOR 4 WEEKS THEN 1/2 TABLET DAILY THEREAFTER.    . bisoprolol (ZEBETA) 10 MG tablet Take 1 tablet (10 mg total) by mouth daily. 90 tablet 1  . ELIQUIS 5 MG TABS tablet Take 1 tablet (5 mg total) by mouth 2 (two) times daily. 180 tablet 1  . lisinopril (PRINIVIL,ZESTRIL) 10 MG tablet Take by mouth.    . Multiple Vitamin (MULTIVITAMIN) tablet Take 1 tablet by mouth daily.     No facility-administered medications prior to visit.     No Known Allergies  ROS As per HPI  PE: Blood pressure 139/90, pulse 90, temperature 97.9 F (36.6 C), temperature source Oral, resp. rate 16, height 6' 3.59" (1.92 m), weight 197 lb 6.4 oz (89.5 kg), SpO2 99 %. Gen: Alert, well appearing.  Patient is oriented to person, place, time, and situation. AFFECT: pleasant, lucid thought and speech. ZOX:WRUE: no injection, icteris, swelling, or  exudate.  EOMI, PERRLA. Mouth: lips without lesion/swelling.  Oral mucosa pink and moist. Oropharynx without erythema, exudate, or swelling.  CV: RRR, 4/6 systolic murmur, S1 and S2 obscured, no diastolic murmur.  No r/g.   LUNGS: CTA bilat, nonlabored resps, good aeration in all lung fields. ABD: soft, NT, ND, BS normal.  No hepatospenomegaly or mass.  No bruits. EXT: no clubbing or cyanosis.  no edema.    LABS:    Chemistry      Component Value Date/Time   NA 138 10/18/2018 0959   K 4.9 10/18/2018 0959   CL 102 10/18/2018 0959   CO2 30 10/18/2018 0959   BUN 12 10/18/2018 0959   CREATININE 0.88 10/18/2018 0959      Component Value Date/Time   CALCIUM 9.6 10/18/2018 0959    ALKPHOS 33 (L) 10/18/2018 0959   AST 11 10/18/2018 0959   ALT 17 10/18/2018 0959   BILITOT 0.5 10/18/2018 0959      IMPRESSION AND PLAN:  Abdominal pain, unknown etiology. CBC, CMET, ESR, CRP, TSH, lipase, and UA all normal at initial visit for this 2 mo ago. Will obtain CT abd/pelv with contrast for further evaluation. Pt expressed understanding and agreement with the plan. No meds rx'd at this time.  Will repeat BMET in prep for contrast.  An After Visit Summary was printed and given to the patient.  FOLLOW UP: To be determined based on results of pending workup.  Signed:  Crissie Sickles, MD           12/23/2018

## 2018-12-24 LAB — BASIC METABOLIC PANEL
BUN: 14 mg/dL (ref 7–25)
CO2: 29 mmol/L (ref 20–32)
Calcium: 9.9 mg/dL (ref 8.6–10.3)
Chloride: 97 mmol/L — ABNORMAL LOW (ref 98–110)
Creat: 0.98 mg/dL (ref 0.70–1.33)
Glucose, Bld: 74 mg/dL (ref 65–99)
Potassium: 5.3 mmol/L (ref 3.5–5.3)
SODIUM: 135 mmol/L (ref 135–146)

## 2018-12-28 ENCOUNTER — Other Ambulatory Visit: Payer: Self-pay

## 2018-12-28 ENCOUNTER — Ambulatory Visit
Admission: RE | Admit: 2018-12-28 | Discharge: 2018-12-28 | Disposition: A | Discharge status: Home / Self Care | Payer: 59 | Source: Ambulatory Visit | Attending: Family Medicine | Admitting: Family Medicine

## 2018-12-28 DIAGNOSIS — K4090 Unilateral inguinal hernia, without obstruction or gangrene, not specified as recurrent: Secondary | ICD-10-CM | POA: Diagnosis not present

## 2018-12-28 DIAGNOSIS — R1031 Right lower quadrant pain: Secondary | ICD-10-CM

## 2018-12-28 MED ORDER — IOPAMIDOL (ISOVUE-300) INJECTION 61%
100.0000 mL | Freq: Once | INTRAVENOUS | Status: AC | PRN
  Administered 2018-12-28: 100 mL via INTRAVENOUS

## 2018-12-29 ENCOUNTER — Encounter (HOSPITAL_COMMUNITY): Payer: Self-pay

## 2018-12-30 ENCOUNTER — Other Ambulatory Visit (HOSPITAL_COMMUNITY): Payer: Self-pay | Admitting: *Deleted

## 2019-01-09 DIAGNOSIS — I4891 Unspecified atrial fibrillation: Secondary | ICD-10-CM | POA: Diagnosis not present

## 2019-01-11 ENCOUNTER — Encounter (HOSPITAL_COMMUNITY): Payer: Self-pay | Admitting: Nurse Practitioner

## 2019-01-11 ENCOUNTER — Encounter (HOSPITAL_COMMUNITY): Payer: Self-pay | Admitting: *Deleted

## 2019-01-11 ENCOUNTER — Other Ambulatory Visit: Payer: Self-pay

## 2019-01-11 ENCOUNTER — Ambulatory Visit (HOSPITAL_COMMUNITY)
Admission: RE | Admit: 2019-01-11 | Discharge: 2019-01-11 | Disposition: A | Discharge status: Home / Self Care | Payer: 59 | Source: Ambulatory Visit | Attending: Nurse Practitioner | Admitting: Nurse Practitioner

## 2019-01-11 VITALS — BP 138/82 | HR 87 | Ht 75.75 in | Wt 197.3 lb

## 2019-01-11 DIAGNOSIS — Z7901 Long term (current) use of anticoagulants: Secondary | ICD-10-CM | POA: Insufficient documentation

## 2019-01-11 DIAGNOSIS — I4819 Other persistent atrial fibrillation: Secondary | ICD-10-CM | POA: Insufficient documentation

## 2019-01-11 DIAGNOSIS — Z87891 Personal history of nicotine dependence: Secondary | ICD-10-CM | POA: Diagnosis not present

## 2019-01-11 DIAGNOSIS — Z79899 Other long term (current) drug therapy: Secondary | ICD-10-CM | POA: Diagnosis not present

## 2019-01-11 LAB — BASIC METABOLIC PANEL
Anion gap: 10 (ref 5–15)
BUN: 11 mg/dL (ref 6–20)
CO2: 27 mmol/L (ref 22–32)
Calcium: 9.1 mg/dL (ref 8.9–10.3)
Chloride: 100 mmol/L (ref 98–111)
Creatinine, Ser: 0.94 mg/dL (ref 0.61–1.24)
GFR calc Af Amer: >60 mL/min (ref >60)
GFR calc non Af Amer: >60 mL/min (ref >60)
Glucose, Bld: 82 mg/dL (ref 70–99)
Potassium: 4.5 mmol/L (ref 3.5–5.1)
SODIUM: 137 mmol/L (ref 135–145)

## 2019-01-11 LAB — CBC
HCT: 42.9 % (ref 39.0–52.0)
Hemoglobin: 14.4 g/dL (ref 13.0–17.0)
MCH: 32.1 pg (ref 26.0–34.0)
MCHC: 33.6 g/dL (ref 30.0–36.0)
MCV: 95.5 fL (ref 80.0–100.0)
Platelets: 259 10*3/uL (ref 150–400)
RBC: 4.49 MIL/uL (ref 4.22–5.81)
RDW: 12.4 % (ref 11.5–15.5)
WBC: 6.7 10*3/uL (ref 4.0–10.5)
nRBC: 0 % (ref 0.0–0.2)

## 2019-01-11 NOTE — Patient Instructions (Signed)
Cardioversion scheduled for Thursday, March 26th  - Arrive at the [facility] and go to admitting at 10:30am  -Do not eat or drink anything after midnight the night prior to your procedure.  - Take all your morning medications with a sip of water prior to arrival.  - You will not be able to drive home after your procedure.

## 2019-01-11 NOTE — Progress Notes (Signed)
Spoke with patient regarding his instructions for DOS.  Patient denies SOB, chest pain, fever, cough, congestions, n/v.  Patient was instructed to stop now all vitamin, supplements, Ibuprofen, motrin, aleve, fish oil, and stop vaping until after the procedure.    PCP - [place] Cardiologist - [place]  Chest x-ray - denies EKG - 01/11/2019 Stress Test - denies ECHO - 10/11/2018 Cardiac Cath - denies  Sleep Study - denies CPAP - n/a  Per Dr Sallyanne Kuster, patient to arrive at 1030, nothing to eat or drink after midnight tonight, sip of water in the morning (01/12/19) to take amiodarone, bisoprolol and eliquis.  Patient informed of hospital visitation restriction policy and understands that his wife Robert Braun will be his ride home upon discharge from hospital.    Patient states the neither he nor his wife Robert Braun have had any of the following symptoms:  Cough yes/no: No Fever (>100.63F)  yes/no: No Runny nose yes/no: No Sore throat yes/no: No Difficulty breathing/shortness of breath  yes/no: No Congestions  No Muscle aches  No  Have you or your wife Robert Braun traveled in the last 14 days? yes/no: No   Patient verbalized understanding of all pre-op instructions.

## 2019-01-11 NOTE — Progress Notes (Signed)
Yes, thanks!  I've already been through his chart and agree that this should not be delayed any further MCr

## 2019-01-11 NOTE — Progress Notes (Signed)
Primary Care Physician: Tammi Sou, MD Referring Physician: Dr. Gwenlyn Found Cardiologist: Dr. [name] is a 53 y.o. male with a h/o persistent afib, transposition of the great arteries(  discovered  2019 in the [place]), recently moved here form the [place], as his wife was given a job as Mudlogger of IT  with [employer]. He is [place]. He was initially seen in the afib clinic January 2020.  He gave a story of developing Pneumonia in JAN-FEB of 2019. At that time new onset afib was noted. He was started on anticoagulation and had a cardioversion in June which was successful. It  held one month. He was then seen by a general  cardiologist and an Adult Congential Heart Disease specialist in the [place]. They both suggested restoring sinus rhythm, the cardiologist stated that he would probably require amiodarone to maintain SR as he has severely dilated left atrium. The congential specialist wanted additional testing when SR was restored. Their plans were limited with pt planning to move to the [place]. He did arrive in the fall but it took time to establish with a PCP and then referral to cardiology and then to the afib clinic.He was in afib with rvr, v rates in the 120's and was tolerating well. Has a remote history of tobacco abuse/ more recently vaping and alcohol use.   F/u in afib clinic, 01/11/19. After I saw him I discussed with Dr. Rayann Heman and he felt that he should be referred to EP and Adult Congential specialist at [facility]. He was seen there for consult 11/22/2018. The note I have is of the EP visit with Dr. Marcello Moores. He felt the best approach was to load with amiodarone with subsequent cardioversion after loading. His office called after that appointment and asked for the cardioversion to be set up here for the convenience of the pt. He was loaded on 400 mg bid for 4 weeks and is now on 200 mg daily. He has tolerated this well. Ekg shows today afib at 87 bpm. He has not missed any doses of  anticoagulation. He will have cardiac MRI at [facility] in the spring after hopefully restoring SR per Dr. Marcello Moores office note. Weight  is stable.  Today, he denies symptoms of palpitations, chest pain, shortness of breath, orthopnea, PND, lower extremity edema, dizziness, presyncope, syncope, or neurologic sequela. The patient is tolerating medications without difficulties and is otherwise without complaint today.   Past Medical History:  Diagnosis Date  . Atrial fibrillation ([place])    09/2018: Dr. Gwenlyn Found referred him to a-fib clinic for consideration or redo CV or ablation. After eval by a-fib clinic, he was referred to [facility] to adult congenital specialist and EP: amiodarone started, cMRI to be repeated.  . 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  Also Systemic RV EF 41%, Mild systemic AR and mild/mod systemic AV valve regurg.  Normal fxn of subpulmonic LV.  Marland Kitchen Migraines    Past Surgical History:  Procedure Laterality Date  . CARDIAC SURGERY     correction of transposition of the great arteries noted on cardiac MRI done in the [place].  Marland Kitchen CARDIOVERSION  03/2018   pt only briefly held sinus rhythm  . TRANSTHORACIC [place]CHOCARDIOGRAM     [place]pt [place] any results.  . TRANSTHORACIC ECHOCARDIOGRAM  09/2018   EF for both ventricles 25%,  severe systemic AV (tricuspid) regurg--> pt needs gated CT angio [facility].    Current Outpatient Medications  Medication Sig Dispense Refill  . amiodarone (PACERONE) 400 MG tablet Take 200 mg by mouth daily.     . bisoprolol (ZEBETA) 10 MG tablet Take 1 tablet (10 mg total) by mouth daily. 90 tablet 1  . ELIQUIS 5 MG TABS tablet Take 1 tablet (5 mg total) by mouth 2 (two) times daily. 180 tablet 1  . lisinopril (PRINIVIL,ZESTRIL) 10 MG tablet Take 10 mg by mouth daily at 3 pm.      No current  facility-administered medications for this encounter.     No Known Allergies  Social History   Socioeconomic History  . Marital status: Married    Spouse name: Not on file  . Number of children: Not on file  . Years of education: Not on file  . Highest education level: Not on file  Occupational History  . Not on file  Social Needs  . Financial resource strain: Not on file  . Food insecurity:    Worry: Not on file    Inability: Not on file  . Transportation needs:    Medical: Not on file    Non-medical: Not on file  Tobacco Use  . Smoking status: Former Smoker    Types: Cigarettes    Last attempt to quit: 11/19/2013    Years since quitting: 5.1  . Smokeless tobacco: Never Used  Substance and Sexual Activity  . Alcohol use: Yes  . Drug use: Never  . Sexual activity: Not on file  Lifestyle  . Physical activity:    Days per week: Not on file    Minutes per session: Not on file  . Stress: Not on file  Relationships  . Social connections:    Talks on phone: Not on file    Gets together: Not on file    Attends religious service: Not on file    Active member of club or organization: Not on file    Attends meetings of clubs or organizations: Not on file    Relationship status: Not on file  . Intimate partner violence:    Fear of current or ex partner: Not on file    Emotionally abused: Not on file    Physically abused: Not on file    Forced sexual activity: Not on file  Other Topics Concern  . Not on file  Social History Narrative   Married, 1 daughter.   Orig from [place].   Lived in [place] 20 yrs, then moved to [facility] 04/2018.   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   Smoker until 2015-->switched to vap.   Alcohol: some beer and wine several days per week--sometimes >3 glasses of wine.   No drugs.    Family History  Problem Relation Age of Onset  . Parkinson's disease Mother   . Hypertension Father     ROS- All systems are reviewed and negative except  as per the HPI above  Physical Exam: Vitals:   01/11/19 1353  Weight: 89.5 kg  Height: 6' 3.75" (1.924 m)   Wt Readings from Last 3 Encounters:  01/11/19 89.5 kg  12/23/18 89.5 kg  10/26/18 88.9 kg    Labs: Lab Results  Component Value Date   NA 135 12/23/2018   K 5.3 12/23/2018   CL 97 (L) 12/23/2018   CO2 29 12/23/2018   GLUCOSE 74 12/23/2018  BUN 14 12/23/2018   CREATININE 0.98 12/23/2018   CALCIUM 9.9 12/23/2018   No results found for: INR No results found for: CHOL, HDL, LDLCALC, TRIG   GEN- The patient is well appearing, alert and oriented x 3 today.   Head- normocephalic, atraumatic Eyes-  Sclera clear, conjunctiva pink Ears- hearing intact Oropharynx- clear Neck- supple, no JVP Lymph- no cervical lymphadenopathy Lungs- Clear to ausculation bilaterally, normal work of breathing Heart- irregular rate and rhythm, soft systolic  murmurs, rubs or gallops, PMI not laterally displaced GI- soft, NT, ND, + BS Extremities- no clubbing, cyanosis, or edema MS- no significant deformity or atrophy Skin- no rash or lesion Psych- euthymic mood, full affect Neuro- strength and sensation are intact  EKG-afib at 87 bpm, LVH, qrs int 146 ms, qtc 495 ms Pt's notes form cardiology, Adult congential Specialist, EKG, Cardaic MRI, all previously from [place] reviewed Office note from Dr. Donne Anon, 11/22/18, reviewed    Assessment and Plan: 1. 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) He has been in persistent afib, for one year,  since January  of last year,  following successful cardioversion in June 2019,and then return to afib since He has severely dilated Left atrium so probably will not maintain SR without an antiarrythmic, it was suggested to use amiodarone in the [place] but was moving to the [place] and did not follow with plans there He was seen at [facility] by EP and Adult  Congenital Specialty 11/22/18, Has been loading on amiodarone since then and is now ready for cardioversion Risk vrs benefit explained to pt Bmet/cbc today  He will continue with Eliquis 5 mg bid for a chadsvasc score of 1  (lv dysfunction) States no missed does for the last 3 weeks  F/u in afib clinic 4/2. Plans are in place for him to f/u at [facility] in the late Spring  Ailie Gage C. Sherrian Nunnelley, [facility] [address] [place], [place] [address] [phone]

## 2019-01-11 NOTE — H&P (View-Only) (Signed)
Primary Care Physician: Tammi Sou, MD Referring Physician: Dr. Gwenlyn Found Cardiologist: Dr. Pryor Curia is a 53 y.o. male with a h/o persistent afib, transposition of the great arteries(  discovered  2019 in the [place]), recently moved here form the [place], as his wife was given a job as Mudlogger of IT  with [employer]. [place] is [place]. He was initially seen in the afib clinic January 2020.  He gave a story of developing Pneumonia in JAN-FEB of 2019. At that time new onset afib was noted. He was started on anticoagulation and had a cardioversion in June which was successful. It  held one month. He was then seen by a general  cardiologist and an Adult Congential Heart Disease specialist in the [place]. They both suggested restoring sinus rhythm, the cardiologist stated that he would probably require amiodarone to maintain SR as he has severely dilated left atrium. The congential specialist wanted additional testing when SR was restored. Their plans were limited with pt planning to move to the [place]. He did arrive in the fall but it took time to establish with a PCP and then referral to cardiology and then to the afib clinic.He was in afib with rvr, v rates in the 120's and was tolerating well. Has a remote history of tobacco abuse/ more recently vaping and alcohol use.   F/u in afib clinic, 01/11/19. After I saw him I discussed with Dr. Rayann Heman and he felt that he should be referred to EP and Adult Congential specialist at [facility]. He was seen there for consult 11/22/2018. The note I have is of the EP visit with Dr. Marcello Moores. He felt the best approach was to load with amiodarone with subsequent cardioversion after loading. His office called after that appointment and asked for the cardioversion to be set up here for the convenience of the pt. He was loaded on 400 mg bid for 4 weeks and is now on 200 mg daily. He has tolerated this well. Ekg shows today afib at 87 bpm. He has not missed any doses of  anticoagulation. He will have cardiac MRI at [facility] in the spring after hopefully restoring SR per Dr. Marcello Moores office note. Weight  is stable.  Today, he denies symptoms of palpitations, chest pain, shortness of breath, orthopnea, PND, lower extremity edema, dizziness, presyncope, syncope, or neurologic sequela. The patient is tolerating medications without difficulties and is otherwise without complaint today.   Past Medical History:  Diagnosis Date  . Atrial fibrillation ([place])    09/2018: Dr. Gwenlyn Found referred him to a-fib clinic for consideration or redo CV or ablation. After eval by a-fib clinic, he was referred to [facility] to adult congenital specialist and EP: amiodarone started, cMRI to be repeated.  . 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  Also Systemic RV EF 41%, Mild systemic AR and mild/mod systemic AV valve regurg.  Normal fxn of subpulmonic LV.  Marland Kitchen Migraines    Past Surgical History:  Procedure Laterality Date  . CARDIAC SURGERY     correction of transposition of the great arteries noted on cardiac MRI done in the [place].  Marland Kitchen CARDIOVERSION  03/2018   pt only briefly held sinus rhythm  . TRANSTHORACIC [place]CHOCARDIOGRAM     [place] any results.  . TRANSTHORACIC ECHOCARDIOGRAM  09/2018   EF for both ventricles 25%,  severe systemic AV (tricuspid) regurg--> pt needs gated CT angio and [facility].    Current Outpatient Medications  Medication Sig Dispense Refill  . amiodarone (PACERONE) 400 MG tablet Take 200 mg by mouth daily.     . bisoprolol (ZEBETA) 10 MG tablet Take 1 tablet (10 mg total) by mouth daily. 90 tablet 1  . ELIQUIS 5 MG TABS tablet Take 1 tablet (5 mg total) by mouth 2 (two) times daily. 180 tablet 1  . lisinopril (PRINIVIL,ZESTRIL) 10 MG tablet Take 10 mg by mouth daily at 3 pm.      No current  facility-administered medications for this encounter.     No Known Allergies  Social History   Socioeconomic History  . Marital status: Married    Spouse name: Not on file  . Number of children: Not on file  . Years of education: Not on file  . Highest education level: Not on file  Occupational History  . Not on file  Social Needs  . Financial resource strain: Not on file  . Food insecurity:    Worry: Not on file    Inability: Not on file  . Transportation needs:    Medical: Not on file    Non-medical: Not on file  Tobacco Use  . Smoking status: Former Smoker    Types: Cigarettes    Last attempt to quit: 11/19/2013    Years since quitting: 5.1  . Smokeless tobacco: Never Used  Substance and Sexual Activity  . Alcohol use: Yes  . Drug use: Never  . Sexual activity: Not on file  Lifestyle  . Physical activity:    Days per week: Not on file    Minutes per session: Not on file  . Stress: Not on file  Relationships  . Social connections:    Talks on phone: Not on file    Gets together: Not on file    Attends religious service: Not on file    Active member of club or organization: Not on file    Attends meetings of clubs or organizations: Not on file    Relationship status: Not on file  . Intimate partner violence:    Fear of current or ex partner: Not on file    Emotionally abused: Not on file    Physically abused: Not on file    Forced sexual activity: Not on file  Other Topics Concern  . Not on file  Social History Narrative   Married, 1 daughter.   Orig from [place].   Lived in [place] 20 yrs, then moved to [facility] 04/2018.   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   Smoker until 2015-->switched to vap.   Alcohol: some beer and wine several days per week--sometimes >3 glasses of wine.   No drugs.    Family History  Problem Relation Age of Onset  . Parkinson's disease Mother   . Hypertension Father     ROS- All systems are reviewed and negative except  as per the HPI above  Physical Exam: Vitals:   01/11/19 1353  Weight: 89.5 kg  Height: 6' 3.75" (1.924 m)   Wt Readings from Last 3 Encounters:  01/11/19 89.5 kg  12/23/18 89.5 kg  10/26/18 88.9 kg    Labs: Lab Results  Component Value Date   NA 135 12/23/2018   K 5.3 12/23/2018   CL 97 (L) 12/23/2018   CO2 29 12/23/2018   GLUCOSE 74 12/23/2018  BUN 14 12/23/2018   CREATININE 0.98 12/23/2018   CALCIUM 9.9 12/23/2018   No results found for: INR No results found for: CHOL, HDL, LDLCALC, TRIG   GEN- The patient is well appearing, alert and oriented x 3 today.   Head- normocephalic, atraumatic Eyes-  Sclera clear, conjunctiva pink Ears- hearing intact Oropharynx- clear Neck- supple, no JVP Lymph- no cervical lymphadenopathy Lungs- Clear to ausculation bilaterally, normal work of breathing Heart- irregular rate and rhythm, soft systolic  murmurs, rubs or gallops, PMI not laterally displaced GI- soft, NT, ND, + BS Extremities- no clubbing, cyanosis, or edema MS- no significant deformity or atrophy Skin- no rash or lesion Psych- euthymic mood, full affect Neuro- strength and sensation are intact  EKG-afib at 87 bpm, LVH, qrs int 146 ms, qtc 495 ms Pt's notes form cardiology, Adult congential Specialist, EKG, Cardaic MRI, all previously from [place] reviewed Office note from Dr. Donne Anon, 11/22/18, reviewed    Assessment and Plan: 1. 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) He has been in persistent afib, for one year,  since January  of last year,  following successful cardioversion in June 2019,and then return to afib since He has severely dilated Left atrium so probably will not maintain SR without an antiarrythmic, it was suggested to use amiodarone in the [place] but was moving to the [place] and did not follow with plans there He was seen at [facility] by EP and Adult  Congenital Specialty 11/22/18, Has been loading on amiodarone since then and is now ready for cardioversion Risk vrs benefit explained to pt Bmet/cbc today  He will continue with Eliquis 5 mg bid for a chadsvasc score of 1  (lv dysfunction) States no missed does for the last 3 weeks  F/u in afib clinic 4/2. Plans are in place for him to f/u at [facility] in the late Spring  Celise Bazar C. Karalynn Cottone, [facility] [address] [place], [place] [address] [phone]

## 2019-01-12 ENCOUNTER — Encounter (HOSPITAL_COMMUNITY): Payer: Self-pay

## 2019-01-12 ENCOUNTER — Encounter (HOSPITAL_COMMUNITY)
Admission: RE | Disposition: A | Discharge status: Home / Self Care | Payer: Self-pay | Source: Home / Self Care | Attending: Cardiovascular Disease

## 2019-01-12 ENCOUNTER — Ambulatory Visit (HOSPITAL_COMMUNITY): Payer: 59 | Admitting: Nurse Practitioner

## 2019-01-12 ENCOUNTER — Ambulatory Visit (HOSPITAL_COMMUNITY)
Admission: RE | Admit: 2019-01-12 | Discharge: 2019-01-12 | Disposition: A | Discharge status: Home / Self Care | Payer: 59 | Attending: Cardiovascular Disease | Admitting: Cardiovascular Disease

## 2019-01-12 ENCOUNTER — Ambulatory Visit (HOSPITAL_COMMUNITY): Payer: 59 | Admitting: Anesthesiology

## 2019-01-12 ENCOUNTER — Other Ambulatory Visit: Payer: Self-pay

## 2019-01-12 ENCOUNTER — Encounter (HOSPITAL_COMMUNITY): Payer: Self-pay | Admitting: *Deleted

## 2019-01-12 DIAGNOSIS — Q249 Congenital malformation of heart, unspecified: Secondary | ICD-10-CM

## 2019-01-12 DIAGNOSIS — Z7901 Long term (current) use of anticoagulants: Secondary | ICD-10-CM | POA: Insufficient documentation

## 2019-01-12 DIAGNOSIS — Z8774 Personal history of (corrected) congenital malformations of heart and circulatory system: Secondary | ICD-10-CM | POA: Insufficient documentation

## 2019-01-12 DIAGNOSIS — Z79899 Other long term (current) drug therapy: Secondary | ICD-10-CM | POA: Insufficient documentation

## 2019-01-12 DIAGNOSIS — Z87891 Personal history of nicotine dependence: Secondary | ICD-10-CM | POA: Diagnosis not present

## 2019-01-12 DIAGNOSIS — I4819 Other persistent atrial fibrillation: Secondary | ICD-10-CM | POA: Insufficient documentation

## 2019-01-12 HISTORY — DX: Personal history of urinary calculi: Z87.442

## 2019-01-12 HISTORY — DX: Pneumonia, unspecified organism: J18.9

## 2019-01-12 HISTORY — DX: Presence of spectacles and contact lenses: Z97.3

## 2019-01-12 HISTORY — PX: CARDIOVERSION: SHX1299

## 2019-01-12 HISTORY — DX: Unspecified osteoarthritis, unspecified site: M19.90

## 2019-01-12 HISTORY — DX: Cardiac arrhythmia, unspecified: I49.9

## 2019-01-12 SURGERY — CARDIOVERSION
Anesthesia: General

## 2019-01-12 MED ORDER — PROPOFOL 10 MG/ML IV BOLUS
INTRAVENOUS | Status: DC | PRN
  Administered 2019-01-12: 100 mg via INTRAVENOUS

## 2019-01-12 MED ORDER — BISOPROLOL FUMARATE 5 MG PO TABS: 5.0000 mg | ORAL_TABLET | Freq: Every day | ORAL | 3 refills | Status: DC

## 2019-01-12 MED ORDER — LIDOCAINE 2% (20 MG/ML) 5 ML SYRINGE
INTRAMUSCULAR | Status: DC | PRN
  Administered 2019-01-12: 20 mg via INTRAVENOUS

## 2019-01-12 MED ORDER — SODIUM CHLORIDE 0.9 % IV SOLN
INTRAVENOUS | Status: DC
  Administered 2019-01-12: 11:00:00 via INTRAVENOUS

## 2019-01-12 NOTE — Anesthesia Procedure Notes (Signed)
Procedure Name: General with mask airway Date/Time: 01/12/2019 12:55 PM Performed by: Imagene Riches, CRNA Pre-anesthesia Checklist: Patient identified, Emergency Drugs available, Suction available and Patient being monitored Patient Re-evaluated:Patient Re-evaluated prior to induction Oxygen Delivery Method: Ambu bag Preoxygenation: Pre-oxygenation with 100% oxygen

## 2019-01-12 NOTE — Transfer of Care (Signed)
Immediate Anesthesia Transfer of Care Note  Patient: Robert Braun  Procedure(s) Performed: CARDIOVERSION (N/A )  Patient Location: Endoscopy Unit  Anesthesia Type:General  Level of Consciousness: awake and alert   Airway & Oxygen Therapy: Patient Spontanous Breathing and Patient connected to nasal cannula oxygen  Post-op Assessment: Report given to RN and Post -op Vital signs reviewed and stable  Post vital signs: Reviewed and stable  Last Vitals:  Vitals Value Taken Time  BP    Temp    Pulse    Resp    SpO2      Last Pain:  Vitals:   01/12/19 1035  TempSrc: Oral  PainSc: 0-No pain         Complications: No apparent anesthesia complications

## 2019-01-12 NOTE — Discharge Instructions (Signed)
Electrical Cardioversion, Care After °This sheet gives you information about how to care for yourself after your procedure. Your health care provider may also give you more specific instructions. If you have problems or questions, contact your health care provider. °What can I expect after the procedure? °After the procedure, it is common to have: °· Some redness on the skin where the shocks were given. °Follow these instructions at home: ° °· Do not drive for 24 hours if you were given a medicine to help you relax (sedative). °· Take over-the-counter and prescription medicines only as told by your health care provider. °· Ask your health care provider how to check your pulse. Check it often. °· Rest for 48 hours after the procedure or as told by your health care provider. °· Avoid or limit your caffeine use as told by your health care provider. °Contact a health care provider if: °· You feel like your heart is beating too quickly or your pulse is not regular. °· You have a serious muscle cramp that does not go away. °Get help right away if: ° °· You have discomfort in your chest. °· You are dizzy or you feel faint. °· You have trouble breathing or you are short of breath. °· Your speech is slurred. °· You have trouble moving an arm or leg on one side of your body. °· Your fingers or toes turn cold or blue. °This information is not intended to replace advice given to you by your health care provider. Make sure you discuss any questions you have with your health care provider. °Document Released: 07/26/2013 Document Revised: 05/08/2016 Document Reviewed: 04/10/2016 °Elsevier Interactive Patient Education © 2019 [employer] Inc[employer] ° °

## 2019-01-12 NOTE — Anesthesia Preprocedure Evaluation (Signed)
Anesthesia Evaluation  Patient identified by MRN, date of birth, ID band Patient awake    Reviewed: Allergy & Precautions, NPO status , Patient's Chart, lab work & pertinent test results  History of Anesthesia Complications Negative for: history of anesthetic complications  Airway Mallampati: I  TM Distance: >3 FB Neck ROM: Full    Dental  (+) Dental Advisory Given   Pulmonary Current Smoker (vapes),    breath sounds clear to auscultation       Cardiovascular hypertension, Pt. on medications + dysrhythmias Atrial Fibrillation  Rhythm:Regular Rate:Normal  S/p transposition repair: systemic ventricle EF 40%   Neuro/Psych  Headaches,    GI/Hepatic negative GI ROS, Neg liver ROS,   Endo/Other  negative endocrine ROS  Renal/GU negative Renal ROS     Musculoskeletal  (+) Arthritis ,   Abdominal   Peds  Hematology eliquis   Anesthesia Other Findings   Reproductive/Obstetrics                             Anesthesia Physical Anesthesia Plan  ASA: III  Anesthesia Plan: General   Post-op Pain Management:    Induction: Intravenous  PONV Risk Score and Plan: 2 and Treatment may vary due to age or medical condition  Airway Management Planned: Natural Airway and Nasal Cannula  Additional Equipment:   Intra-op Plan:   Post-operative Plan:   Informed Consent: I have reviewed the patients History and Physical, chart, labs and discussed the procedure including the risks, benefits and alternatives for the proposed anesthesia with the patient or authorized representative who has indicated his/her understanding and acceptance.     Dental advisory given  Plan Discussed with: CRNA and Surgeon  Anesthesia Plan Comments:         Anesthesia Quick Evaluation

## 2019-01-12 NOTE — Op Note (Signed)
Procedure: Electrical Cardioversion Indications:  Atrial Fibrillation  Procedure Details:  Consent: Risks of procedure as well as the alternatives and risks of each were explained to the (patient/caregiver).  Consent for procedure obtained.  Time Out: Verified patient identification, verified procedure, site/side was marked, verified correct patient position, special equipment/implants available, medications/allergies/relevent history reviewed, required imaging and test results available.  Performed  Patient placed on cardiac monitor, pulse oximetry, supplemental oxygen as necessary.  Sedation given: propofol 100 mg IV, Dr. Glennon Mac Pacer pads placed anterior and posterior chest.  Cardioverted 2 time(s).  Cardioversion with synchronized biphasic 120J shock, unsuccessful; repeat synchronized shock at 200J was successful.  Evaluation: Findings: Post procedure EKG shows: sinus sinus bradycardia 44 bpm Complications: None Patient did tolerate procedure well.  Time Spent Directly with the Patient:  30 minutes   Robert Braun 01/12/2019, 12:52 PM

## 2019-01-12 NOTE — Anesthesia Postprocedure Evaluation (Signed)
Anesthesia Post Note  Patient: Robert Braun  Procedure(s) Performed: CARDIOVERSION (N/A )     Patient location during evaluation: Endoscopy Anesthesia Type: General Level of consciousness: awake and alert, oriented and patient cooperative Pain management: pain level controlled Vital Signs Assessment: post-procedure vital signs reviewed and stable Respiratory status: spontaneous breathing, nonlabored ventilation and respiratory function stable Cardiovascular status: blood pressure returned to baseline and stable Postop Assessment: no apparent nausea or vomiting Anesthetic complications: no    Last Vitals:  Vitals:   01/12/19 1305 01/12/19 1310  BP:  120/72  Pulse:  (!) 45  Resp:  12  Temp:    SpO2: 98% 98%    Last Pain:  Vitals:   01/12/19 1310  TempSrc:   PainSc: 0-No pain                 [name],[name]

## 2019-01-12 NOTE — Interval H&P Note (Signed)
History and Physical Interval Note:  01/12/2019 11:10 AM  Robert Braun  has presented today for surgery, with the diagnosis of a fib.  The various methods of treatment have been discussed with the patient and family. After consideration of risks, benefits and other options for treatment, the patient has consented to  Procedure(s): CARDIOVERSION (N/A) as a surgical intervention.  The patient's history has been reviewed, patient examined, no change in status, stable for surgery.  I have reviewed the patient's chart and labs.  Questions were answered to the patient's satisfaction.     Kamika Goodloe

## 2019-01-13 ENCOUNTER — Encounter (HOSPITAL_COMMUNITY): Payer: Self-pay | Admitting: Cardiovascular Disease

## 2019-01-19 ENCOUNTER — Other Ambulatory Visit: Payer: Self-pay

## 2019-01-19 ENCOUNTER — Ambulatory Visit (HOSPITAL_COMMUNITY)
Admission: RE | Admit: 2019-01-19 | Discharge: 2019-01-19 | Disposition: A | Discharge status: Home / Self Care | Payer: 59 | Source: Ambulatory Visit | Attending: Nurse Practitioner | Admitting: Nurse Practitioner

## 2019-01-19 ENCOUNTER — Encounter (HOSPITAL_COMMUNITY): Payer: Self-pay | Admitting: Nurse Practitioner

## 2019-01-19 VITALS — BP 132/88 | HR 57 | Ht 75.75 in | Wt 196.2 lb

## 2019-01-19 DIAGNOSIS — Z79899 Other long term (current) drug therapy: Secondary | ICD-10-CM | POA: Diagnosis not present

## 2019-01-19 DIAGNOSIS — I4819 Other persistent atrial fibrillation: Secondary | ICD-10-CM | POA: Insufficient documentation

## 2019-01-19 DIAGNOSIS — Z87891 Personal history of nicotine dependence: Secondary | ICD-10-CM | POA: Diagnosis not present

## 2019-01-19 DIAGNOSIS — Z7901 Long term (current) use of anticoagulants: Secondary | ICD-10-CM | POA: Insufficient documentation

## 2019-01-19 DIAGNOSIS — Q205 Discordant atrioventricular connection: Secondary | ICD-10-CM | POA: Diagnosis not present

## 2019-01-19 DIAGNOSIS — R001 Bradycardia, unspecified: Secondary | ICD-10-CM | POA: Insufficient documentation

## 2019-01-19 DIAGNOSIS — R9431 Abnormal electrocardiogram [ECG] [EKG]: Secondary | ICD-10-CM | POA: Insufficient documentation

## 2019-01-19 DIAGNOSIS — Q249 Congenital malformation of heart, unspecified: Secondary | ICD-10-CM | POA: Diagnosis not present

## 2019-01-19 DIAGNOSIS — Z8249 Family history of ischemic heart disease and other diseases of the circulatory system: Secondary | ICD-10-CM | POA: Insufficient documentation

## 2019-01-19 NOTE — Progress Notes (Signed)
Primary Care Physician: Tammi Sou, MD Referring Physician: Dr. Gwenlyn Found Cardiologist: Dr. Pryor Curia is a 53 y.o. male with a h/o persistent afib, transposition of the great arteries(  discovered  2019 in the [place]), recently moved here form the [place], as his wife was given a job as Mudlogger of IT  with [employer]. He is [place] form [place]. He was initially seen in the afib clinic January 2020.  He gave a story of developing Pneumonia in JAN-FEB of 2019. At that time new onset afib was noted. He was started on anticoagulation and had a cardioversion in June which was successful. It  held one month. He was then seen by a general  cardiologist and an Adult Congential Heart Disease specialist in the [place]. They both suggested restoring sinus rhythm, the cardiologist stated that he would probably require amiodarone to maintain SR as he has severely dilated left atrium. The congential specialist wanted additional testing when SR was restored. Their plans were limited with pt planning to move to the [place]. He did arrive in the fall but it took time to establish with a PCP and then referral to cardiology and then to the afib clinic.He was in afib with rvr, v rates in the 120's and was tolerating well. Has a remote history of tobacco abuse/ more recently vaping and alcohol use.   F/u in afib clinic, 01/11/19. After I saw him I discussed with Dr. Rayann Heman and he felt that he should be referred to EP and Adult Congential specialist at [facility]. He was seen there for consult 11/22/2018. The note I have is of the EP visit with Dr. Marcello Moores. He felt the best approach was to load with amiodarone with subsequent cardioversion after loading. His office called after that appointment and asked for the cardioversion to be set up here for the convenience of the pt. He was loaded on 400 mg bid for 4 weeks and is now on 200 mg daily. He has tolerated this well. Ekg shows today afib at 87 bpm. He has not missed any doses of  anticoagulation. He will have cardiac MRI at [facility] in the spring after hopefully restoring SR per Dr. Marcello Moores office note. Weight  is stable.  F/u in afib clinic, 4/2. He is here f/u cardioversion. He did have successful cardioversion but had sinus brady at 44 bpm on return to SR. HIs bisoprolol was reduced and he is in sinus brady at 57 bpm today. He feels much improved. .  Today, he denies symptoms of palpitations, chest pain, shortness of breath, orthopnea, PND, lower extremity edema, dizziness, presyncope, syncope, or neurologic sequela. The patient is tolerating medications without difficulties and is otherwise without complaint today.   Past Medical History:  Diagnosis Date  . Arthritis    foot  . Atrial fibrillation ([place])    09/2018: Dr. Gwenlyn Found referred him to a-fib clinic for consideration or redo CV or ablation. After eval by a-fib clinic, he was referred to [facility] to adult congenital specialist and EP: amiodarone started, cMRI to be repeated.  . 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  Also Systemic RV EF 41%, Mild systemic AR and mild/mod systemic AV valve regurg.  Normal fxn of subpulmonic LV.  Marland Kitchen Dysrhythmia    a-fib  . History of kidney stones    passed stone - no surgery  . Migraines  occasional  . Pneumonia 10/2017  . Wears glasses    Past Surgical History:  Procedure Laterality Date  . CARDIAC SURGERY     correction of transposition of the great arteries noted on cardiac MRI done in the [place]., pt denies heart surgery  . CARDIOVERSION  03/2018   pt only briefly held sinus rhythm in [place]  . CARDIOVERSION N/A 01/12/2019   Procedure: CARDIOVERSION;  Surgeon: Sanda Klein, MD;  Location: [facility] END[facility];  Service: Cardiovascular;  Laterality: N/A;  . TRANSTHORACIC ECHOCARDIOGRAM     in the [place]-->pt cannot recall any results.  . TRANSTHORACIC ECHOCARDIOGRAM   09/2018   EF for both ventricles 25%, severe systemic AV (tricuspid) regurg--> pt needs gated CT angio and [facility].  . WISDOM TOOTH EXTRACTION      Current Outpatient Medications  Medication Sig Dispense Refill  . amiodarone (PACERONE) 400 MG tablet Take 200 mg by mouth daily.     . bisoprolol (ZEBETA) 5 MG tablet Take 1 tablet (5 mg total) by mouth daily. 90 tablet 3  . ELIQUIS 5 MG TABS tablet Take 1 tablet (5 mg total) by mouth 2 (two) times daily. 180 tablet 1  . lisinopril (PRINIVIL,ZESTRIL) 10 MG tablet Take 10 mg by mouth daily at 3 pm.      No current facility-administered medications for this encounter.     No Known Allergies  Social History   Socioeconomic History  . Marital status: Married    Spouse name: Not on file  . Number of children: Not on file  . Years of education: Not on file  . Highest education level: Not on file  Occupational History  . Not on file  Social Needs  . Financial resource strain: Not on file  . Food insecurity:    Worry: Not on file    Inability: Not on file  . Transportation needs:    Medical: Not on file    Non-medical: Not on file  Tobacco Use  . Smoking status: Former Smoker    Years: 31.00    Types: Cigarettes    Last attempt to quit: 11/19/2013    Years since quitting: 5.1  . Smokeless tobacco: Never Used  Substance and Sexual Activity  . Alcohol use: Yes    Alcohol/week: 4.0 - 8.0 standard drinks    Types: 4 - 8 Glasses of wine per week    Comment: 1-2 bottles wine on Weekends (Fri-Sun)  . Drug use: Never  . Sexual activity: Not on file  Lifestyle  . Physical activity:    Days per week: Not on file    Minutes per session: Not on file  . Stress: Not on file  Relationships  . Social connections:    Talks on phone: Not on file    Gets together: Not on file    Attends religious service: Not on file    Active member of club or organization: Not on file    Attends meetings of clubs or  organizations: Not on file    Relationship status: Not on file  . Intimate partner violence:    Fear of current or ex partner: Not on file    Emotionally abused: Not on file    Physically abused: Not on file    Forced sexual activity: Not on file  Other Topics Concern  . Not on file  Social History Narrative   Married, 1 daughter.   Orig from [place].   Lived in [place] 20 yrs,  then moved to [facility] 04/2018.   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   Smoker until 2015-->switched to vap.   Alcohol: some beer and wine several days per week--sometimes >3 glasses of wine.   No drugs.    Family History  Problem Relation Age of Onset  . Parkinson's disease Mother   . Hypertension Father     ROS- All systems are reviewed and negative except as per the HPI above  Physical Exam: Vitals:   01/19/19 0937  BP: 132/88  Pulse: (!) 57  Weight: 89 kg  Height: 6' 3.75" (1.924 m)   Wt Readings from Last 3 Encounters:  01/19/19 89 kg  01/12/19 89.5 kg  01/11/19 89.5 kg    Labs: Lab Results  Component Value Date   NA 137 01/11/2019   K 4.5 01/11/2019   CL 100 01/11/2019   CO2 27 01/11/2019   GLUCOSE 82 01/11/2019   BUN 11 01/11/2019   CREATININE 0.94 01/11/2019   CALCIUM 9.1 01/11/2019   No results found for: INR No results found for: CHOL, HDL, LDLCALC, TRIG   GEN- The patient is well appearing, alert and oriented x 3 today.   Head- normocephalic, atraumatic Eyes-  Sclera clear, conjunctiva pink Ears- hearing intact Oropharynx- clear Neck- supple, no JVP Lymph- no cervical lymphadenopathy Lungs- Clear to ausculation bilaterally, normal work of breathing Heart-  regular rate and rhythm, soft systolic  murmurs, rubs or gallops, PMI not laterally displaced GI- soft, NT, ND, + BS Extremities- no clubbing, cyanosis, or edema MS- no significant deformity or atrophy Skin- no rash or lesion Psych- euthymic mood, full affect Neuro- strength and sensation are intact  EKG-  sinus brady at 57 bpm, PR int 202 ms, qrs int 142 ms, qtc 469 ms    Assessment and Plan: 1. 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 Marcello Moores at [facility] He was seen at [facility] by EP and Adult Congenital Specialty 11/22/18  Continue  amiodarone 200 mg daily Continue bisoprolol at 5 mg daily He will continue with Eliquis 5 mg bid for a chadsvasc score of 1  (lv dysfunction) States no missed does for the last 3 weeks  Pt has appointments  in place for him to f/u at [facility] in May  [place] Boccio C. Aliah Eriksson, [facility] [address] [place], [place] [address] [phone]

## 2019-03-07 DIAGNOSIS — I519 Heart disease, unspecified: Secondary | ICD-10-CM | POA: Diagnosis not present

## 2019-03-07 DIAGNOSIS — Q228 Other congenital malformations of tricuspid valve: Secondary | ICD-10-CM | POA: Diagnosis not present

## 2019-03-07 DIAGNOSIS — Q203 Discordant ventriculoarterial connection: Secondary | ICD-10-CM | POA: Diagnosis not present

## 2019-03-28 ENCOUNTER — Other Ambulatory Visit: Payer: Self-pay

## 2019-03-28 ENCOUNTER — Encounter: Payer: 59 | Admitting: Family Medicine

## 2019-03-28 MED ORDER — ELIQUIS 5 MG PO TABS: 5.0000 mg | ORAL_TABLET | Freq: Two times a day (BID) | ORAL | 0 refills | Status: DC

## 2019-06-20 HISTORY — PX: OTHER SURGICAL HISTORY: SHX169

## 2019-06-21 ENCOUNTER — Other Ambulatory Visit: Payer: Self-pay | Admitting: Family Medicine

## 2019-06-21 NOTE — Telephone Encounter (Signed)
Patient is going to contact the pharmacy. This refill should have gone to his cardiologist

## 2019-06-21 NOTE — Telephone Encounter (Signed)
Left message for patient to call office regarding his refill request - CPE has to be scheduled before filling meds.

## 2019-06-21 NOTE — Telephone Encounter (Signed)
Noted  

## 2019-06-21 NOTE — Telephone Encounter (Signed)
Please schedule patient for CPE (fasting) before refill is provided. Thank you.

## 2019-07-06 ENCOUNTER — Telehealth: Payer: Self-pay | Admitting: Cardiovascular Disease

## 2019-07-06 NOTE — Telephone Encounter (Signed)
LVM for pt to call and schedule his 6 mo followup with Dr. Gwenlyn Found.

## 2019-08-02 ENCOUNTER — Encounter: Payer: Self-pay | Admitting: Gastroenterology

## 2019-08-02 ENCOUNTER — Other Ambulatory Visit: Payer: Self-pay

## 2019-08-02 ENCOUNTER — Encounter: Payer: Self-pay | Admitting: Family Medicine

## 2019-08-02 ENCOUNTER — Ambulatory Visit (INDEPENDENT_AMBULATORY_CARE_PROVIDER_SITE_OTHER): Payer: 59 | Admitting: Family Medicine

## 2019-08-02 VITALS — BP 152/82 | HR 57 | Temp 98.4°F | Resp 16 | Ht 75.0 in | Wt 200.1 lb

## 2019-08-02 DIAGNOSIS — Z0000 Encounter for general adult medical examination without abnormal findings: Secondary | ICD-10-CM

## 2019-08-02 DIAGNOSIS — Z23 Encounter for immunization: Secondary | ICD-10-CM

## 2019-08-02 DIAGNOSIS — I4819 Other persistent atrial fibrillation: Secondary | ICD-10-CM

## 2019-08-02 DIAGNOSIS — R1030 Lower abdominal pain, unspecified: Secondary | ICD-10-CM | POA: Diagnosis not present

## 2019-08-02 DIAGNOSIS — Z1211 Encounter for screening for malignant neoplasm of colon: Secondary | ICD-10-CM | POA: Diagnosis not present

## 2019-08-02 DIAGNOSIS — I10 Essential (primary) hypertension: Secondary | ICD-10-CM

## 2019-08-02 DIAGNOSIS — Z125 Encounter for screening for malignant neoplasm of prostate: Secondary | ICD-10-CM

## 2019-08-02 MED ORDER — BISOPROLOL FUMARATE 10 MG PO TABS: 10.0000 mg | ORAL_TABLET | Freq: Every day | ORAL | 3 refills | Status: DC

## 2019-08-02 MED ORDER — LISINOPRIL 40 MG PO TABS: 40.0000 mg | ORAL_TABLET | Freq: Every day | ORAL | 3 refills | Status: DC

## 2019-08-02 NOTE — Patient Instructions (Addendum)
Check your blood pressure and heart rate daily for the next 2 weeks and write the numbers down so we can review them at a follow up visit in 2 wks.  Health Maintenance, Male Adopting a healthy lifestyle and getting preventive care are important in promoting health and wellness. Ask your health care provider about:  The right schedule for you to have regular tests and exams.  Things you can do on your own to prevent diseases and keep yourself healthy. What should I know about diet, weight, and exercise? Eat a healthy diet   Eat a diet that includes plenty of vegetables, fruits, low-fat dairy products, and lean protein.  Do not eat a lot of foods that are high in solid fats, added sugars, or sodium. Maintain a healthy weight Body mass index (BMI) is a measurement that can be used to identify possible weight problems. It estimates body fat based on height and weight. Your health care provider can help determine your BMI and help you achieve or maintain a healthy weight. Get regular exercise Get regular exercise. This is one of the most important things you can do for your health. Most adults should:  Exercise for at least 150 minutes each week. The exercise should increase your heart rate and make you sweat (moderate-intensity exercise).  Do strengthening exercises at least twice a week. This is in addition to the moderate-intensity exercise.  Spend less time sitting. Even light physical activity can be beneficial. Watch cholesterol and blood lipids Have your blood tested for lipids and cholesterol at 52 years of age, then have this test every 5 years. You may need to have your cholesterol levels checked more often if:  Your lipid or cholesterol levels are high.  You are older than 52 years of age.  You are at high risk for heart disease. What should I know about cancer screening? Many types of cancers can be detected early and may often be prevented. Depending on your health history  and family history, you may need to have cancer screening at various ages. This may include screening for:  Colorectal cancer.  Prostate cancer.  Skin cancer.  Lung cancer. What should I know about heart disease, diabetes, and high blood pressure? Blood pressure and heart disease  High blood pressure causes heart disease and increases the risk of stroke. This is more likely to develop in people who have high blood pressure readings, are of African descent, or are overweight.  Talk with your health care provider about your target blood pressure readings.  Have your blood pressure checked: ? Every 3-5 years if you are 76-8 years of age. ? Every year if you are 24 years old or older.  If you are between the ages of 65 and 49 and are a current or former smoker, ask your health care provider if you should have a one-time screening for abdominal aortic aneurysm (AAA). Diabetes Have regular diabetes screenings. This checks your fasting blood sugar level. Have the screening done:  Once every three years after age 14 if you are at a normal weight and have a low risk for diabetes.  More often and at a younger age if you are overweight or have a high risk for diabetes. What should I know about preventing infection? Hepatitis B If you have a higher risk for hepatitis B, you should be screened for this virus. Talk with your health care provider to find out if you are at risk for hepatitis B infection. Hepatitis C  Blood testing is recommended for:  Everyone born from [birth year] through 1964.  Anyone with known risk factors for hepatitis C. Sexually transmitted infections (STIs)  You should be screened each year for STIs, including gonorrhea and chlamydia, if: ? You are sexually active and are younger than 52 years of age. ? You are older than 52 years of age and your health care provider tells you that you are at risk for this type of infection. ? Your sexual activity has changed since you were  last screened, and you are at increased risk for chlamydia or gonorrhea. Ask your health care provider if you are at risk.  Ask your health care provider about whether you are at high risk for HIV. Your health care provider may recommend a prescription medicine to help prevent HIV infection. If you choose to take medicine to prevent HIV, you should first get tested for HIV. You should then be tested every 3 months for as long as you are taking the medicine. Follow these instructions at home: Lifestyle  Do not use any products that contain nicotine or tobacco, such as cigarettes, e-cigarettes, and chewing tobacco. If you need help quitting, ask your health care provider.  Do not use street drugs.  Do not share needles.  Ask your health care provider for help if you need support or information about quitting drugs. Alcohol use  Do not drink alcohol if your health care provider tells you not to drink.  If you drink alcohol: ? Limit how much you have to 0-2 drinks a day. ? Be aware of how much alcohol is in your drink. In the [place] one drink equals one 12 oz bottle of beer (355 mL), one 5 oz glass of wine (148 mL), or one 1 oz glass of hard liquor (44 mL). General instructions  Schedule regular health, dental, and eye exams.  Stay current with your vaccines.  Tell your health care provider if: ? You often feel depressed. ? You have ever been abused or do not feel safe at home. Summary  Adopting a healthy lifestyle and getting preventive care are important in promoting health and wellness.  Follow your health care provider's instructions about healthy diet, exercising, and getting tested or screened for diseases.  Follow your health care provider's instructions on monitoring your cholesterol and blood pressure. This information is not intended to replace advice given to you by your health care provider. Make sure you discuss any questions you have with your health care provider.  Document Released: 04/02/2008 Document Revised: 09/28/2018 Document Reviewed: 09/28/2018 Elsevier Patient Education  2020 [employer].

## 2019-08-02 NOTE — Progress Notes (Signed)
Office Note 08/02/2019  CC:  Chief Complaint  Patient presents with  . Annual Exam    not fasting. does not want flu shot    HPI:  Robert Braun is a 53 y.o. male who is here for annual health maintenance exam.  Has cardiologist at [facility], says he increased both his bisoprolol and lisinopril were increased 2 wks ago  (to 10mg  and 40mg  respectively). Pt has cuff for home use but has not started using it yet.  He reports hx of white coat syndrome.  Reports that the lower abd pains he was having at the beginning of this year have returned now for the last month or so.  I did w/u for this problem -end of 2019, beginning of 2020->blood work normal, urine normal, CT abd/pelv remarkable only for some constipation, nonspecific bladder wall thickening, and small bilat fat-containing inguinal hernias.    Describes his pain again for me today:  It is diffuse in lower abd extending around sides almost into low back at times.  Mild/nagging level of pain that stays the same all the time.  No nausea or change in appetite.  No known trigger and no alleviating factor.  No constipation or diarrhea.  No melena or hematochezia.  No dysuria, urinary frequency, urgency, or gross hematuria.  No urinary hesitancy or decrease in force of stream.  No testicular/scrotal pain. Denies any recent abd strain. He takes not meds for this pain.     Past Medical History:  Diagnosis Date  . Arthritis    foot  . Atrial fibrillation ([place])    09/2018: Dr. Gwenlyn Found referred him to a-fib clinic for consideration or redo CV or ablation. After eval by a-fib clinic, he was referred to [facility] to adult congenital specialist and EP: amiodarone started, cMRI to be repeated.  . 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  Also Systemic RV EF 41%, Mild systemic AR and mild/mod systemic AV valve regurg.  Normal fxn  of subpulmonic LV.  Marland Kitchen Dysrhythmia    a-fib  . History of kidney stones    passed stone - no surgery  . Migraines    occasional  . Pneumonia 10/2017  . Wears glasses     Past Surgical History:  Procedure Laterality Date  . CARDIAC SURGERY     correction of transposition of the great arteries noted on cardiac MRI done in the [place]., pt denies heart surgery  . CARDIOVERSION  03/2018   pt only briefly held sinus rhythm in [place]  . CARDIOVERSION N/A 01/12/2019   Procedure: CARDIOVERSION;  Surgeon: Sanda Klein, MD;  Location: [facility];  Service: Cardiovascular;  Laterality: N/A;  . TRANSTHORACIC [place]CHOCARDIOGRAM     [place] any results.  . TRANSTHORACIC ECHOCARDIOGRAM  09/2018   EF for both ventricles 25%, severe systemic AV (tricuspid) regurg--> pt needs gated CT [facility].  . WISDOM TOOTH EXTRACTION      Family History  Problem Relation Age of Onset  . Parkinson's disease Mother   . Hypertension Father     Social History   Socioeconomic History  . Marital status: Married    Spouse name: Not on file  . Number of children: Not on file  . Years of education: Not on file  . Highest education level: Not on file  Occupational History  .  Not on file  Social Needs  . Financial resource strain: Not on file  . Food insecurity    Worry: Not on file    Inability: Not on file  . Transportation needs    Medical: Not on file    Non-medical: Not on file  Tobacco Use  . Smoking status: Former Smoker    Years: 31.00    Types: Cigarettes    Quit date: 11/19/2013    Years since quitting: 5.7  . Smokeless tobacco: Never Used  Substance and Sexual Activity  . Alcohol use: Yes    Alcohol/week: 4.0 - 8.0 standard drinks    Types: 4 - 8 Glasses of wine per week    Comment: 1-2 bottles wine on Weekends (Fri-Sun)  . Drug use: Never  . Sexual activity: Not on file  Lifestyle  . Physical activity    Days per week: Not on file     Minutes per session: Not on file  . Stress: Not on file  Relationships  . Social Herbalist on phone: Not on file    Gets together: Not on file    Attends religious service: Not on file    Active member of club or organization: Not on file    Attends meetings of clubs or organizations: Not on file    Relationship status: Not on file  . Intimate partner violence    Fear of current or ex partner: Not on file    Emotionally abused: Not on file    Physically abused: Not on file    Forced sexual activity: Not on file  Other Topics Concern  . Not on file  Social History Narrative   Married, 1 daughter.   Orig from [place].   Lived in [place] 20 yrs, then moved to [facility] 04/2018.   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   Smoker until 2015-->switched to vap.   Alcohol: some beer and wine several days per week--sometimes >3 glasses of wine.   No drugs.    Outpatient Medications Prior to Visit  Medication Sig Dispense Refill  . amiodarone (PACERONE) 400 MG tablet Take 200 mg by mouth daily. Taking half a 200mg  tablet    . ELIQUIS 5 MG TABS tablet Take 1 tablet (5 mg total) by mouth 2 (two) times daily. 180 tablet 0  . Multiple Vitamin (MULTI-VITAMIN) tablet Take by mouth.    . bisoprolol (ZEBETA) 5 MG tablet Take 1 tablet (5 mg total) by mouth daily. (Patient taking differently: Take 10 mg by mouth daily. ) 90 tablet 3  . lisinopril (PRINIVIL,ZESTRIL) 10 MG tablet Take 40 mg by mouth daily in the afternoon.      No facility-administered medications prior to visit.     No Known Allergies  ROS Review of Systems  Constitutional: Negative for appetite change, chills, fatigue and fever.  HENT: Negative for congestion, dental problem, ear pain and sore throat.   Eyes: Negative for discharge, redness and visual disturbance.  Respiratory: Negative for cough, chest tightness, shortness of breath and wheezing.   Cardiovascular: Negative for chest pain, palpitations and leg  swelling.  Gastrointestinal: Positive for abdominal pain (diffuse lower abd-see hpi). Negative for blood in stool, diarrhea, nausea and vomiting.  Genitourinary: Negative for difficulty urinating, dysuria, flank pain, frequency, hematuria and urgency.  Musculoskeletal: Negative for arthralgias, back pain, joint swelling, myalgias and neck stiffness.  Skin: Negative for pallor and rash.  Neurological: Negative for dizziness, speech difficulty,  weakness and headaches.  Hematological: Negative for adenopathy. Does not bruise/bleed easily.  Psychiatric/Behavioral: Negative for confusion and sleep disturbance. The patient is not nervous/anxious.     PE;  Blood pressure (!) 152/82, pulse (!) 57, temperature 98.4 F (36.9 C), temperature source Temporal, resp. rate 16, height 6\' 3"  (1.905 m), weight 200 lb 2 oz (90.8 kg), SpO2 100 %. Body mass index is 25.01 kg/m.    Gen: Alert, well appearing.  Patient is oriented to person, place, time, and situation. AFFECT: pleasant, lucid thought and speech. ENT: Ears: EACs clear, normal epithelium.  TMs with good light reflex and landmarks bilaterally.  Eyes: no injection, icteris, swelling, or exudate.  EOMI, PERRLA. Nose: no drainage or turbinate edema/swelling.  No injection or focal lesion.  Mouth: lips without lesion/swelling.  Oral mucosa pink and moist.  Dentition intact and without obvious caries or gingival swelling.  Oropharynx without erythema, exudate, or swelling.  Neck: supple/nontender.  No LAD, mass, or TM.  Carotid pulses 2+ bilaterally, without bruits. CV: RRR, 2/6 systolic murmur, best heard at apex.  No diastolic murmur, no r/g.   LUNGS: CTA bilat, nonlabored resps, good aeration in all lung fields. ABD: soft, mild diffuse lower abd tenderness w/out guarding or rebound. ND, BS normal.  No hepatospenomegaly or mass.  No bruits. EXT: no clubbing, cyanosis, or edema.  Musculoskeletal: no joint swelling, erythema, warmth, or tenderness.  ROM  of all joints intact. Skin - no sores or suspicious lesions or rashes or color changes Rectal exam: negative without mass, lesions or tenderness, PROSTATE EXAM: smooth and symmetric without nodules or tenderness.   Pertinent labs:  Lab Results  Component Value Date   TSH 0.70 10/18/2018   Lab Results  Component Value Date   WBC 6.7 01/11/2019   HGB 14.4 01/11/2019   HCT 42.9 01/11/2019   MCV 95.5 01/11/2019   PLT 259 01/11/2019   Lab Results  Component Value Date   CREATININE 0.94 01/11/2019   BUN 11 01/11/2019   NA 137 01/11/2019   K 4.5 01/11/2019   CL 100 01/11/2019   CO2 27 01/11/2019   Lab Results  Component Value Date   ALT 17 10/18/2018   AST 11 10/18/2018   ALKPHOS 33 (L) 10/18/2018   BILITOT 0.5 10/18/2018   No results found for: CHOL No results found for: HDL No results found for: LDLCALC No results found for: TRIG No results found for: CHOLHDL No results found for: PSA  No results found for: HGBA1C   ASSESSMENT AND PLAN:   1)HTN: not ideal control but need som home data to compare office numbers to. I sent in rx's for new dosing of his bisop and lisin (10 and 40 qd respectively. Check your blood pressure and heart rate daily for the next 2 weeks and write the numbers down so we can review them at a follow up visit in 2 wks. Instructions sheet on checking home bp's given to pt today.  2) Lower abd pain: unknown etiology. My w/u 2019/2020 did not reveal any significant issue. Refer to GI for help.  3) Health maintenance exam: Reviewed age and gender appropriate health maintenance issues (prudent diet, regular exercise, health risks of tobacco and excessive alcohol, use of seatbelts, fire alarms in home, use of sunscreen).  Also reviewed age and gender appropriate health screening as well as vaccine recommendations. Vaccines: Tdap->given today.  Flu vaccine->he declined today. Labs:  Fasting HP + PSA--future when fasting. Prostate ca screening: DRE  normal  today, PSA future. Colon ca screening: has never had screening.  With his lower abd discomfort returning w/out explanation I'll refer him to GI for this problem and to discuss colon ca screening.  An After Visit Summary was printed and given to the patient.  FOLLOW UP:  Return in about 2 weeks (around 08/16/2019) for f/u HTN; also needs fasting lab appt at his earliest convenience.  Signed:  Crissie Sickles, MD           08/02/2019

## 2019-08-03 ENCOUNTER — Ambulatory Visit (INDEPENDENT_AMBULATORY_CARE_PROVIDER_SITE_OTHER): Payer: 59 | Admitting: Family Medicine

## 2019-08-03 DIAGNOSIS — Z125 Encounter for screening for malignant neoplasm of prostate: Secondary | ICD-10-CM | POA: Diagnosis not present

## 2019-08-03 DIAGNOSIS — I4819 Other persistent atrial fibrillation: Secondary | ICD-10-CM | POA: Diagnosis not present

## 2019-08-03 DIAGNOSIS — I10 Essential (primary) hypertension: Secondary | ICD-10-CM

## 2019-08-03 LAB — LIPID PANEL
Cholesterol: 189 mg/dL (ref 0–200)
HDL: 70.8 mg/dL (ref >39.00)
LDL Cholesterol: 97 mg/dL (ref 0–99)
NonHDL: 117.96
Total CHOL/HDL Ratio: 3
Triglycerides: 104 mg/dL (ref 0.0–149.0)
VLDL: 20.8 mg/dL (ref 0.0–40.0)

## 2019-08-03 LAB — CBC WITH DIFFERENTIAL/PLATELET
Basophils Absolute: 0.1 10*3/uL (ref 0.0–0.1)
Basophils Relative: 0.9 % (ref 0.0–3.0)
Eosinophils Absolute: 0.3 10*3/uL (ref 0.0–0.7)
Eosinophils Relative: 3.3 % (ref 0.0–5.0)
HCT: 38.9 % — ABNORMAL LOW (ref 39.0–52.0)
Hemoglobin: 13.1 g/dL (ref 13.0–17.0)
Lymphocytes Relative: 31.3 % (ref 12.0–46.0)
Lymphs Abs: 2.5 10*3/uL (ref 0.7–4.0)
MCHC: 33.6 g/dL (ref 30.0–36.0)
MCV: 96.4 fl (ref 78.0–100.0)
Monocytes Absolute: 0.8 10*3/uL (ref 0.1–1.0)
Monocytes Relative: 10.5 % (ref 3.0–12.0)
Neutro Abs: 4.2 10*3/uL (ref 1.4–7.7)
Neutrophils Relative %: 54 % (ref 43.0–77.0)
Platelets: 234 10*3/uL (ref 150.0–400.0)
RBC: 4.03 Mil/uL — ABNORMAL LOW (ref 4.22–5.81)
RDW: 12.8 % (ref 11.5–15.5)
WBC: 7.8 10*3/uL (ref 4.0–10.5)

## 2019-08-03 LAB — COMPREHENSIVE METABOLIC PANEL
ALT: 13 U/L (ref 0–53)
AST: 11 U/L (ref 0–37)
Albumin: 4.6 g/dL (ref 3.5–5.2)
Alkaline Phosphatase: 33 U/L — ABNORMAL LOW (ref 39–117)
BUN: 13 mg/dL (ref 6–23)
CO2: 33 mEq/L — ABNORMAL HIGH (ref 19–32)
Calcium: 9.5 mg/dL (ref 8.4–10.5)
Chloride: 100 mEq/L (ref 96–112)
Creatinine, Ser: 0.88 mg/dL (ref 0.40–1.50)
GFR: 90.76 mL/min (ref >60.00)
Glucose, Bld: 95 mg/dL (ref 70–99)
Potassium: 4.8 mEq/L (ref 3.5–5.1)
Sodium: 137 mEq/L (ref 135–145)
Total Bilirubin: 0.5 mg/dL (ref 0.2–1.2)
Total Protein: 6.7 g/dL (ref 6.0–8.3)

## 2019-08-03 LAB — PSA: PSA: 0.65 ng/mL (ref 0.10–4.00)

## 2019-08-03 LAB — TSH: TSH: 1.06 u[IU]/mL (ref 0.35–4.50)

## 2019-08-11 ENCOUNTER — Other Ambulatory Visit: Payer: 59

## 2019-08-11 DIAGNOSIS — R5383 Other fatigue: Secondary | ICD-10-CM

## 2019-08-11 DIAGNOSIS — R1031 Right lower quadrant pain: Secondary | ICD-10-CM

## 2019-08-11 LAB — HEMOCCULT SLIDES (X 3 CARDS)
Fecal Occult Blood: NEGATIVE
OCCULT 1: NEGATIVE
OCCULT 2: NEGATIVE
OCCULT 3: NEGATIVE
OCCULT 4: NEGATIVE
OCCULT 5: NEGATIVE

## 2019-08-31 ENCOUNTER — Telehealth: Payer: Self-pay | Admitting: Emergency Medicine

## 2019-08-31 ENCOUNTER — Other Ambulatory Visit: Payer: Self-pay

## 2019-08-31 ENCOUNTER — Encounter: Payer: Self-pay | Admitting: Gastroenterology

## 2019-08-31 ENCOUNTER — Ambulatory Visit: Payer: 59 | Admitting: Gastroenterology

## 2019-08-31 VITALS — BP 142/80 | HR 58 | Temp 97.6°F | Ht 76.0 in | Wt 201.0 lb

## 2019-08-31 DIAGNOSIS — K4020 Bilateral inguinal hernia, without obstruction or gangrene, not specified as recurrent: Secondary | ICD-10-CM

## 2019-08-31 DIAGNOSIS — G8929 Other chronic pain: Secondary | ICD-10-CM | POA: Diagnosis not present

## 2019-08-31 DIAGNOSIS — R1031 Right lower quadrant pain: Secondary | ICD-10-CM | POA: Diagnosis not present

## 2019-08-31 DIAGNOSIS — Z1159 Encounter for screening for other viral diseases: Secondary | ICD-10-CM | POA: Diagnosis not present

## 2019-08-31 MED ORDER — NA SULFATE-K SULFATE-MG SULF 17.5-3.13-1.6 GM/177ML PO SOLN: 1.0000 | ORAL | 0 refills | Status: DC

## 2019-08-31 NOTE — Patient Instructions (Addendum)
I was a pleasure to meet you today. It is definitely time to schedule another colonoscopy to evaluate your pain and for colon cancer screening.   Between now and your colonoscopy, please try taking a stool bulking agent such as something like Metamucil every day.  I'd like to know if this helps your pain between now and the colonoscopy.   Tips for colonoscopy:  - Stay well hydrated for 3-4 days prior to the exam. This reduces nausea and dehydration.  - To prevent skin/hemorrhoid irritation - prior to wiping, put A&D ointment or vaseline on the toilet paper. - Keep a towel or pad on the bed.  - Drink  64oz of clear liquids in the morning of prep day (prior to starting the prep) to be sure that there is enough fluid to flush the colon and stay hydrated!!!! This is in addition to the fluids required for preparation. - Use of a flavored hard candy, such as grape Anise Salvo, can counteract some of the flavor of the prep and may prevent some nausea.   Please call with any questions or concerns prior to your procedure.

## 2019-08-31 NOTE — Telephone Encounter (Signed)
[facility] Pre-operative Risk Assessment     Request for surgical clearance:     Endoscopy Procedure  What type of surgery is being performed?     colonoscopy  When is this surgery scheduled?     09-19-2019  What type of clearance is required ?   Pharmacy  Are there any medications that need to be held prior to surgery and how long? Eliquis 2 days  Practice name and name of physician performing surgery?      [facility]  What is your office phone and fax number?      Phone- [phone]  [phone]  Anesthesia type (None, local, MAC, general) ?       MAC

## 2019-08-31 NOTE — Telephone Encounter (Signed)
Pt takes Eliquis for afib with CHADS2VASc score of 1 (CHF). Renal function is normal. Ok to hold Eliquis for 2 days prior to procedure as requested.

## 2019-08-31 NOTE — Progress Notes (Addendum)
Referring Provider: Tammi Sou, MD Primary Care Physician:  Tammi Sou, MD  Reason for Consultation:  Lower abdominal pain   IMPRESSION:  Right lower abdominal/groin pain Stool in the colon on CT 12/28/2018 Bilateral inguinal hernias on CT 12/28/2018  On Eliquis for atrial fibrillation No prior colon cancer or screening  I have personally reviewed the images from his CT scan and April 2020.  Although the CT scan shows a significant stool burden Mr. Robert Braun does not believe the constipation is a problem with his nor the cause for his pain.  Right lower abdominal pain may be most consistent with an inguinal hernia particularly given the improvement when he lies down in the evening.  The location is unusual for pain related to organic GI disease.  Colonoscopy recommended both to evaluate his lower abdominal pain and for colon cancer screening.  I have recommended holding Eliquis for 2 days before endoscopy.  I discussed with the patient that there is a low, but real, risk of a cardiovascular event such as heart attack, stroke, or embolism/thrombosis while off Eliquis. Will communicate by phone or EMR with patient's prescribing provider to confirm that holding the Eliquis is appropriate at this time.    PLAN: Trial of daily psyllium for stool bulking Colonoscopy after an Eliquis washout if approved by Dr. Ernestine Conrad  The nature of the procedure, as well as the risks, benefits, and alternatives were carefully and thoroughly reviewed with the patient. Ample time for discussion and questions allowed. The patient understood, was satisfied, and agreed to proceed.  Please see the "Patient Instructions" section for addition details about the plan.  HPI: Robert Braun is a 53 y.o. male referred by Dr. Ernestine Conrad for further evaluation of lower abdominal pain.  The history is obtained through the patient and review of his electronic health record. 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  He is on chronic Eliquis.  EF 55% on cardiac MRI at [facility] 06/2019. He also has migraines and a history of nephrolithiasis. From [place] but he lived in the [place] for 20 years before moving to the [place] last year.  He started having diffuse, constant, nagging right lower abdominal/groin pain earlier this year. Resolved without any intervention but then returned one month ago.  Now more bothersome than it was initially.  Worse in the morning.  Radiates around to the flank. No change with eating, defecation, or movement.  No identified inguinal bulge.  No change in bowel habits, diarrhea, constipation. No blood or mucous. Some relief when he lies on his back at night.  Noted change with heavy lifting or straining.  May have an associated low grade fever. However, he doesn't feel like anything is wrong with him other than the pain. No other identified triggers or alleviating factors.   Good appetite. No weight loss.   Work-up with Dr. Ernestine Conrad included normal CMP, CBC, urinalysis.  A CT of the abdomen and pelvis was remarkable only for constipation, nonspecific bladder wall thickening, and small bilateral fat-containing inguinal hernias.  No prior colon cancer screening.  No prior endoscopy.  Prior abdominal imaging: CT of the abdomen and pelvis with contrast 12/28/2018: Circumferential bladder wall thickening, prominent stool throughout the colon, 1.3 cm right adrenal adenoma, bilateral inguinal hernias  No known family history of colon cancer or polyps. No family history of uterine/endometrial cancer, pancreatic cancer or gastric/stomach  cancer.   Past Medical History:  Diagnosis Date  . Arthritis    foot  . Atrial fibrillation ([place])    09/2018: Dr. Gwenlyn Found referred him to a-fib clinic for consideration or redo CV or ablation. After eval by a-fib clinic, he was referred to  [facility] to adult congenital specialist and EP: amiodarone started, cMRI to be repeated.  . 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  Also Systemic RV EF 41%, Mild systemic AR and mild/mod systemic AV valve regurg.  Normal fxn of subpulmonic LV.  Marland Kitchen Dysrhythmia    a-fib  . History of kidney stones    passed stone - no surgery  . Migraines    occasional  . Pneumonia 10/2017  . Wears glasses     Past Surgical History:  Procedure Laterality Date  . CARDIAC SURGERY     correction of transposition of the great arteries noted on cardiac MRI done in the [place]., pt denies heart surgery  . CARDIOVERSION  03/2018   pt only briefly held sinus rhythm in [place]  . CARDIOVERSION N/A 01/12/2019   Procedure: CARDIOVERSION;  Surgeon: Sanda Klein, MD;  Location: [facility];  Service: Cardiovascular;  Laterality: N/A;  . TRANSTHORACIC ECHOCARDIOGRAM     [place] any results.  . TRANSTHORACIC ECHOCARDIOGRAM  09/2018   EF for both ventricles 25%, severe systemic AV (tricuspid) regurg--> pt needs gated CT angio and [facility].  . WISDOM TOOTH EXTRACTION      Current Outpatient Medications  Medication Sig Dispense Refill  . amiodarone (PACERONE) 400 MG tablet Take 200 mg by mouth daily. Taking half a 200mg  tablet    . bisoprolol (ZEBETA) 10 MG tablet Take 1 tablet (10 mg total) by mouth daily. 90 tablet 3  . ELIQUIS 5 MG TABS tablet Take 1 tablet (5 mg total) by mouth 2 (two) times daily. 180 tablet 0  . lisinopril (ZESTRIL) 40 MG tablet Take 1 tablet (40 mg total) by mouth daily in the afternoon. 90 tablet 3  . Multiple Vitamin (MULTI-VITAMIN) tablet Take by mouth.     No current facility-administered medications for this visit.     Allergies as of 08/31/2019  . (No Known Allergies)    Family History  Problem Relation Age of Onset  .  Parkinson's disease Mother   . Hypertension Father     Social History   Socioeconomic History  . Marital status: Married    Spouse name: Not on file  . Number of children: Not on file  . Years of education: Not on file  . Highest education level: Not on file  Occupational History  . Not on file  Social Needs  . Financial resource strain: Not on file  . Food insecurity    Worry: Not on file    Inability: Not on file  . Transportation needs    Medical: Not on file    Non-medical: Not on file  Tobacco Use  . Smoking status: Former Smoker    Years: 31.00    Types: Cigarettes    Quit date: 11/19/2013    Years since quitting: 5.7  . Smokeless tobacco: Never Used  Substance and Sexual Activity  . Alcohol use: Yes    Alcohol/week: 4.0 - 8.0 standard drinks    Types: 4 - 8 Glasses of wine per week  Comment: 1-2 bottles wine on Weekends (Fri-Sun)  . Drug use: Never  . Sexual activity: Not on file  Lifestyle  . Physical activity    Days per week: Not on file    Minutes per session: Not on file  . Stress: Not on file  Relationships  . Social Herbalist on phone: Not on file    Gets together: Not on file    Attends religious service: Not on file    Active member of club or organization: Not on file    Attends meetings of clubs or organizations: Not on file    Relationship status: Not on file  . Intimate partner violence    Fear of current or ex partner: Not on file    Emotionally abused: Not on file    Physically abused: Not on file    Forced sexual activity: Not on file  Other Topics Concern  . Not on file  Social History Narrative   Married, 1 daughter.   Orig from [place].   Lived in [place] 20 yrs, then moved to [facility] 04/2018.   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   Smoker until 2015-->switched to vap.   Alcohol: some beer and wine several days per week--sometimes >3 glasses of wine.   No drugs.    Review of Systems: 12 system ROS is negative  except as noted above except for back pain.   Physical Exam: General:   Alert,  well-nourished, pleasant and cooperative in NAD Head:  Normocephalic and atraumatic. Eyes:  Sclera clear, no icterus.   Conjunctiva pink. Ears:  Normal auditory acuity. Nose:  No deformity, discharge,  or lesions. Mouth:  No deformity or lesions.   Neck:  Supple; no masses or thyromegaly. Lungs:  Clear throughout to auscultation.   No wheezes. Heart:  Regular rate and rhythm; 2 out of 6 systolic murmur best heard over the apex  Abdomen:  Soft, focal tenderness in the right lower groin/abdomen, nondistended, normal bowel sounds, no rebound or guarding. No hepatosplenomegaly.  Negative Carnett's sign. Rectal:  Deferred  Msk:  Symmetrical. No boney deformities LAD: No inguinal or umbilical LAD Extremities:  No clubbing or edema. Neurologic:  Alert and  oriented x4;  grossly nonfocal Skin:  Intact without significant lesions or rashes. Psych:  Alert and cooperative. Normal mood and affect.   Jakylan Ron L. Tarri Glenn, MD, MPH 08/31/2019, 1:46 PM

## 2019-08-31 NOTE — Telephone Encounter (Signed)
Spoke with patient and informed him he will need to hold Eliquis 2 days prior to his procedure and resume as instructed at discharge. Patient verbalized understanding.

## 2019-09-01 NOTE — Telephone Encounter (Signed)
   Primary Cardiologist: Dr. Jamse Arn [facility]) Also followed locally by Roderic Palau, NP, with Afib clinic  Chart reviewed as part of pre-operative protocol coverage.Robert Braun was last seen in our office 01/2019 by Roderic Palau. However, given his cardiac history, he was previously referred to EP and Adult Congenital Specialist at [facility] as he has transposition of the great vessels. He has since been followed by the [facility] cardiology team Dr. Michaelle Birks and Dr. Mendel Ryder who have adjusted medications. From a cardiac clearance standpoint, we would recommend GI team reach out to patient's cardiology team at [facility] to finalize clearance for procedure requested.  Will route this recommendation to requesting provider via Epic (both routing and fax function). Please call with questions.  [name], PA-C 09/01/2019, 11:34 AM

## 2019-09-07 NOTE — Telephone Encounter (Signed)
Fax routed to Dr Michaelle Birks.

## 2019-09-08 NOTE — Telephone Encounter (Signed)
Left message for Dr. Raphael Gibney nurse to confirm receipt

## 2019-09-11 NOTE — Telephone Encounter (Signed)
Called Dr. Raphael Gibney office at [facility] [phone]. They do not see that they rec'd the fax and indicated we should re fax request to [phone]. Refaxed.

## 2019-09-12 NOTE — Telephone Encounter (Signed)
Called [facility] and spoke to nurse Ebony Hail. She found the fax and will get with Dr. Michaelle Birks and get back to [place]. She is aware the procedure is next week on December 1st.

## 2019-09-13 ENCOUNTER — Ambulatory Visit (INDEPENDENT_AMBULATORY_CARE_PROVIDER_SITE_OTHER): Payer: 59

## 2019-09-13 ENCOUNTER — Other Ambulatory Visit: Payer: Self-pay | Admitting: Gastroenterology

## 2019-09-13 DIAGNOSIS — Z1159 Encounter for screening for other viral diseases: Secondary | ICD-10-CM

## 2019-09-13 NOTE — Telephone Encounter (Addendum)
Left a message with Ebony Hail at Dr. Raphael Gibney office to check on status of Eliquis clearance.

## 2019-09-13 NOTE — Telephone Encounter (Signed)
Received fax from Dr. Raphael Gibney office stating patient can hold Eliquis 48 hours prior to his procedure. Pt is aware.

## 2019-09-13 NOTE — Telephone Encounter (Signed)
Spoke with Ebony Hail again at Dr. Raphael Gibney office and explained that patient was informed that he can hold Eliquis 2 days prior to his procedure yesterday but our office was not aware. Ebony Hail states she can see it in the chart now and will fax it to our office.

## 2019-09-13 NOTE — Telephone Encounter (Signed)
Robert Braun returned my call and states she sent a note to Dr. Raphael Gibney heart team regarding Robert Braun clearance but has not heard back from them. Informed Robert Braun that unfortunately we need response today since we are closed the rest of the week. Robert Braun verbalized understanding and states she will send another urgent note to the heart team of Dr. Michaelle Birks and contact me back today with a response.   Spoke with patient to inform him of progress and he states Dr. Raphael Gibney office called him yesterday to hold Robert Braun 2 days prior to his procedure starting Sunday and will we will inform him when to restart. Informed patient I am glad that they contacted him directly but I have not heard this information directly from Dr. Raphael Gibney office but to follow there directions regarding holding Robert Braun and I will confirm with them.

## 2019-09-15 LAB — SARS CORONAVIRUS 2 (TAT 6-24 HRS): SARS Coronavirus 2: NEGATIVE

## 2019-09-19 ENCOUNTER — Encounter: Payer: Self-pay | Admitting: Gastroenterology

## 2019-09-19 ENCOUNTER — Ambulatory Visit (AMBULATORY_SURGERY_CENTER): Payer: 59 | Admitting: Gastroenterology

## 2019-09-19 ENCOUNTER — Other Ambulatory Visit: Payer: Self-pay

## 2019-09-19 VITALS — BP 113/64 | HR 48 | Temp 98.8°F | Resp 13 | Ht 76.0 in | Wt 201.0 lb

## 2019-09-19 DIAGNOSIS — K648 Other hemorrhoids: Secondary | ICD-10-CM

## 2019-09-19 DIAGNOSIS — D128 Benign neoplasm of rectum: Secondary | ICD-10-CM | POA: Diagnosis not present

## 2019-09-19 DIAGNOSIS — D125 Benign neoplasm of sigmoid colon: Secondary | ICD-10-CM

## 2019-09-19 DIAGNOSIS — Z860101 Personal history of adenomatous and serrated colon polyps: Secondary | ICD-10-CM

## 2019-09-19 DIAGNOSIS — Z86010 Personal history of colonic polyps: Secondary | ICD-10-CM

## 2019-09-19 DIAGNOSIS — D124 Benign neoplasm of descending colon: Secondary | ICD-10-CM

## 2019-09-19 DIAGNOSIS — G8929 Other chronic pain: Secondary | ICD-10-CM

## 2019-09-19 HISTORY — DX: Personal history of adenomatous and serrated colon polyps: Z86.0101

## 2019-09-19 HISTORY — PX: COLONOSCOPY W/ POLYPECTOMY: SHX1380

## 2019-09-19 HISTORY — DX: Personal history of colonic polyps: Z86.010

## 2019-09-19 MED ORDER — SODIUM CHLORIDE 0.9 % IV SOLN: 500.0000 mL | Freq: Once | INTRAVENOUS | Status: DC

## 2019-09-19 NOTE — Progress Notes (Signed)
VS-CW Temp-JB

## 2019-09-19 NOTE — Progress Notes (Signed)
PT taken to PACU. Monitors in place. VSS. Report given to RN. 

## 2019-09-19 NOTE — Op Note (Addendum)
[facility] Patient Name: Robert Braun Procedure Date: 09/19/2019 1:59 PM MRN: [id number] Endoscopist: Thornton Park MD, MD Age: 53 Referring MD:  Date of Birth: 02/17/1966 Gender: Male Account #: [id number] Procedure:                Colonoscopy Indications:              Right lower abdominal/groin pain                           Stool in the colon on CT 12/28/2018                           Bilateral inguinal hernias on CT 12/28/2018                           On Eliquis for atrial fibrillation                           No prior colon cancer or screeningRight Medicines:                Monitored Anesthesia Care Procedure:                Pre-Anesthesia Assessment:                           - Prior to the procedure, a History and Physical                            was performed, and patient medications and                            allergies were reviewed. The patient's tolerance of                            previous anesthesia was also reviewed. The risks                            and benefits of the procedure and the sedation                            options and risks were discussed with the patient.                            All questions were answered, and informed consent                            was obtained. Prior Anticoagulants: The patient has                            taken Eliquis (apixaban), last dose was 3 days                            prior to procedure. ASA Grade Assessment: III - A  patient with severe systemic disease. After                            reviewing the risks and benefits, the patient was                            deemed in satisfactory condition to undergo the                            procedure.                           After obtaining informed consent, the colonoscope                            was passed under direct vision. Throughout the                            procedure, the patient's blood  pressure, pulse, and                            oxygen saturations were monitored continuously. The                            Colonoscope was introduced through the anus and                            advanced to the the terminal ileum, with                            identification of the appendiceal orifice and IC                            valve. A second forward view of the right colon was                            performed. The colonoscopy was technically                            difficult and complex due to a redundant colon,                            significant looping and a tortuous colon.                            Successful completion of the procedure was aided by                            applying abdominal pressure. The patient tolerated                            the procedure well. The quality of the bowel  preparation was good. The terminal ileum, ileocecal                            valve, appendiceal orifice, and rectum were                            photographed. Scope In: 2:14:04 PM Scope Out: 2:40:31 PM Scope Withdrawal Time: 0 hours 15 minutes 53 seconds  Total Procedure Duration: 0 hours 26 minutes 27 seconds  Findings:                 The perianal and digital rectal examinations were                            normal except for a soft prominent perianal                            fullness of unclear etiology or clinical                            significance.                           A 2 mm polyp was found in the rectum. The polyp was                            sessile. The polyp was removed with a cold snare.                            Resection and retrieval were complete. Estimated                            blood loss: none.                           A 3 mm polyp was found in the sigmoid colon. The                            polyp was sessile. The polyp was removed with a                            cold snare. Resection and  retrieval were complete.                            Estimated blood loss was minimal.                           A 3 mm polyp was found in the descending colon. The                            polyp was sessile. The polyp was removed with a                            cold snare. Resection and retrieval were complete.  Estimated blood loss was minimal.                           The terminal ileum appeared normal.                           Non-bleeding internal hemorrhoids were found during                            retroflexion. Complications:            No immediate complications. Estimated blood loss:                            Minimal. Estimated Blood Loss:     Estimated blood loss was minimal. Impression:               - One 2 mm polyp in the rectum, removed with a cold                            snare. Resected and retrieved.                           - One 3 mm polyp in the sigmoid colon, removed with                            a cold snare. Resected and retrieved.                           - One 3 mm polyp in the descending colon, removed                            with a cold snare. Resected and retrieved.                           - The examined portion of the ileum was normal.                           - No source for the abdominal pain was identified                            on this examination. Non-GI etiologies should be                            considered.                           - Non-bleeding internal hemorrhoids. Recommendation:           - Patient has a contact number available for                            emergencies. The signs and symptoms of potential                            delayed complications were discussed with the  patient. Return to normal activities tomorrow.                            Written discharge instructions were provided to the                            patient.                           -  Resume previous diet.                           - Continue present medications.                           - Resume Eliquis tomorrow, 09/20/19.                           - Await pathology results.                           - Repeat colonoscopy date to be determined after                            pending pathology results are reviewed for                            surveillance. Thornton Park MD, MD 09/19/2019 2:49:25 PM This report has been signed electronically.

## 2019-09-19 NOTE — Patient Instructions (Signed)
Discharge instructions given. Handouts on polyps and hemorrhoids. Resume previous medications. Resume Eliquis tomorrow. YOU HAD AN ENDOSCOPIC PROCEDURE TODAY AT [facility]:   Refer to the procedure report that was given to you for any specific questions about what was found during the examination.  If the procedure report does not answer your questions, please call your gastroenterologist to clarify.  If you requested that your care partner not be given the details of your procedure findings, then the procedure report has been included in a sealed envelope for you to review at your convenience later.  YOU SHOULD EXPECT: Some feelings of bloating in the abdomen. Passage of more gas than usual.  Walking can help get rid of the air that was put into your GI tract during the procedure and reduce the bloating. If you had a lower endoscopy (such as a colonoscopy or flexible sigmoidoscopy) you may notice spotting of blood in your stool or on the toilet paper. If you underwent a bowel prep for your procedure, you may not have a normal bowel movement for a few days.  Please Note:  You might notice some irritation and congestion in your nose or some drainage.  This is from the oxygen used during your procedure.  There is no need for concern and it should clear up in a day or so.  SYMPTOMS TO REPORT IMMEDIATELY:   Following lower endoscopy (colonoscopy or flexible sigmoidoscopy):  Excessive amounts of blood in the stool  Significant tenderness or worsening of abdominal pains  Swelling of the abdomen that is new, acute  Fever of 100F or higher   For urgent or emergent issues, a gastroenterologist can be reached at any hour by calling [phone].   DIET:  We do recommend a small meal at first, but then you may proceed to your regular diet.  Drink plenty of fluids but you should avoid alcoholic beverages for 24 hours.  ACTIVITY:  You should plan to take it easy for the rest of today  and you should NOT DRIVE or use heavy machinery until tomorrow (because of the sedation medicines used during the test).    FOLLOW UP: Our staff will call the number listed on your records 48-72 hours following your procedure to check on you and address any questions or concerns that you may have regarding the information given to you following your procedure. If we do not reach you, we will leave a message.  We will attempt to reach you two times.  During this call, we will ask if you have developed any symptoms of COVID 19. If you develop any symptoms (ie: fever, flu-like symptoms, shortness of breath, cough etc.) before then, please call [phone].  If you test positive for Covid 19 in the 2 weeks post procedure, please call and report this information to [place].    If any biopsies were taken you will be contacted by phone or by letter within the next 1-3 weeks.  Please call us at [phone] if you have not heard about the biopsies in 3 weeks.    SIGNATURES/CONFIDENTIALITY: You and/or your care partner have signed paperwork which will be entered into your electronic medical record.  These signatures attest to the fact that that the information above on your After Visit Summary has been reviewed and is understood.  Full responsibility of the confidentiality of this discharge information lies with you and/or your care-partner.

## 2019-09-19 NOTE — Progress Notes (Signed)
Called to room to assist during endoscopic procedure.  Patient ID and intended procedure confirmed with present staff. Received instructions for my participation in the procedure from the performing physician.  

## 2019-09-21 ENCOUNTER — Telehealth: Payer: Self-pay

## 2019-09-21 NOTE — Telephone Encounter (Signed)
Attempted to reach patient for post-procedure f/u call. No answer. Left message that we will make another attempt to reach him again later today and for him to please not hesitate to call us if he has any questions/concerns regarding his care. 

## 2019-09-25 ENCOUNTER — Encounter: Payer: Self-pay | Admitting: Gastroenterology

## 2019-10-31 ENCOUNTER — Encounter: Payer: Self-pay | Admitting: Family Medicine

## 2019-12-29 ENCOUNTER — Telehealth: Payer: Self-pay

## 2019-12-29 DIAGNOSIS — I4891 Unspecified atrial fibrillation: Secondary | ICD-10-CM

## 2019-12-29 DIAGNOSIS — Q203 Discordant ventriculoarterial connection: Secondary | ICD-10-CM

## 2019-12-29 DIAGNOSIS — I10 Essential (primary) hypertension: Secondary | ICD-10-CM

## 2019-12-29 NOTE — Telephone Encounter (Signed)
OK, orders are in.

## 2019-12-29 NOTE — Telephone Encounter (Signed)
Received fax from one of pt's other providers at [facility] requesting CMP, TSH, and free T4 to be done and results faxed. Pt's last visit was 08/02/19 for routine f/u and advised to f/u 2 weeks for BP. Fasting lab visit on 08/03/19 included TSH and CMP.   Please advise, thanks.

## 2019-12-29 NOTE — Telephone Encounter (Signed)
Patient contacted regarding labs. Lab visit scheduled for 3/17

## 2020-01-03 ENCOUNTER — Ambulatory Visit (INDEPENDENT_AMBULATORY_CARE_PROVIDER_SITE_OTHER): Payer: 59 | Admitting: Family Medicine

## 2020-01-03 ENCOUNTER — Other Ambulatory Visit: Payer: Self-pay

## 2020-01-03 DIAGNOSIS — I10 Essential (primary) hypertension: Secondary | ICD-10-CM | POA: Diagnosis not present

## 2020-01-03 DIAGNOSIS — Q203 Discordant ventriculoarterial connection: Secondary | ICD-10-CM

## 2020-01-03 DIAGNOSIS — I4891 Unspecified atrial fibrillation: Secondary | ICD-10-CM

## 2020-01-03 NOTE — Addendum Note (Signed)
Addended by: Ralph Dowdy on: 01/03/2020 09:28 AM   Modules accepted: Orders

## 2020-01-04 LAB — T4, FREE: Free T4: 1.5 ng/dL (ref 0.8–1.8)

## 2020-01-04 LAB — COMPREHENSIVE METABOLIC PANEL
AG Ratio: 2.1 (calc) (ref 1.0–2.5)
ALT: 12 U/L (ref 9–46)
AST: 11 U/L (ref 10–35)
Albumin: 4.5 g/dL (ref 3.6–5.1)
Alkaline phosphatase (APISO): 28 U/L — ABNORMAL LOW (ref 35–144)
BUN: 11 mg/dL (ref 7–25)
CO2: 27 mmol/L (ref 20–32)
Calcium: 9.6 mg/dL (ref 8.6–10.3)
Chloride: 99 mmol/L (ref 98–110)
Creat: 0.92 mg/dL (ref 0.70–1.33)
Globulin: 2.1 g/dL (calc) (ref 1.9–3.7)
Glucose, Bld: 96 mg/dL (ref 65–99)
Potassium: 4.8 mmol/L (ref 3.5–5.3)
Sodium: 137 mmol/L (ref 135–146)
Total Bilirubin: 0.5 mg/dL (ref 0.2–1.2)
Total Protein: 6.6 g/dL (ref 6.1–8.1)

## 2020-01-04 LAB — TSH: TSH: 1.09 mIU/L (ref 0.40–4.50)

## 2020-01-28 IMAGING — DX DG LUMBAR SPINE COMPLETE 4+V
5 series · 5 of 5 positions shown · non-contrast
Comparison: None.

CLINICAL DATA: Intermittent lower abdominal pain extending to low
back for 2 months. Impression over last week.

EXAM:
LUMBAR SPINE - COMPLETE 4+ VIEW

[lumbar spine ap]
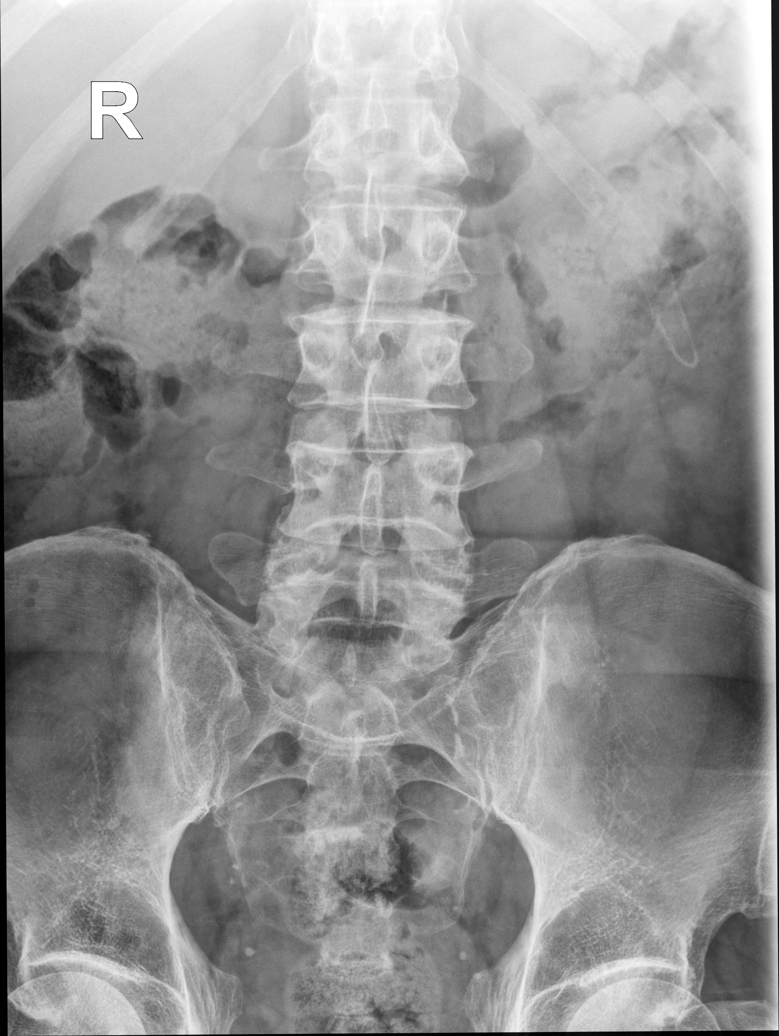

[lumbar spine oblique (1 of 2)]
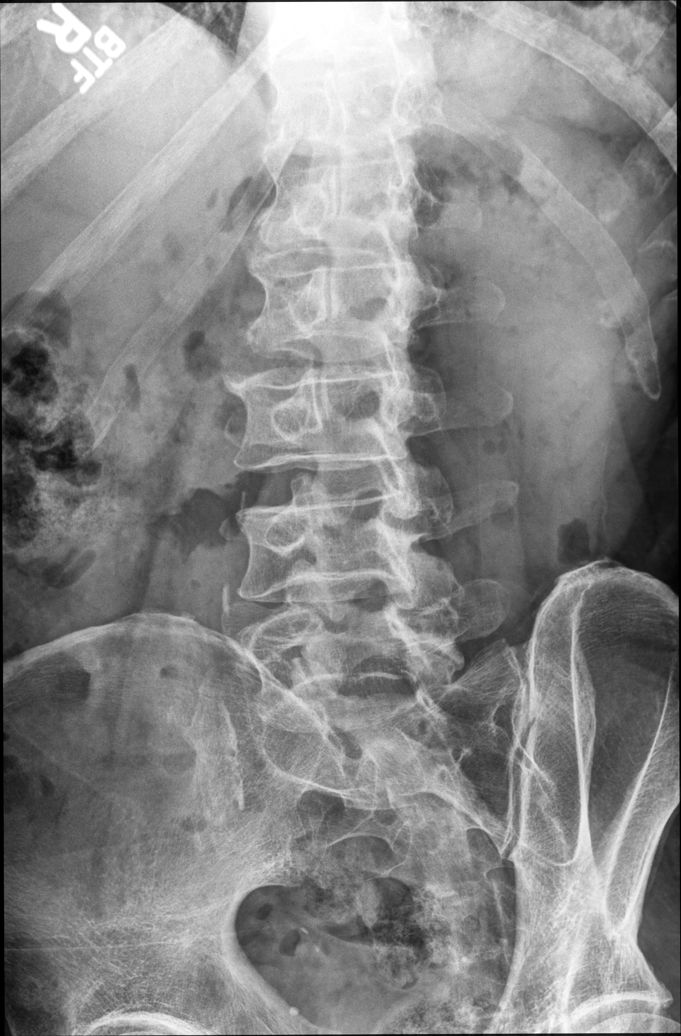

[lumbar spine oblique (2 of 2)]
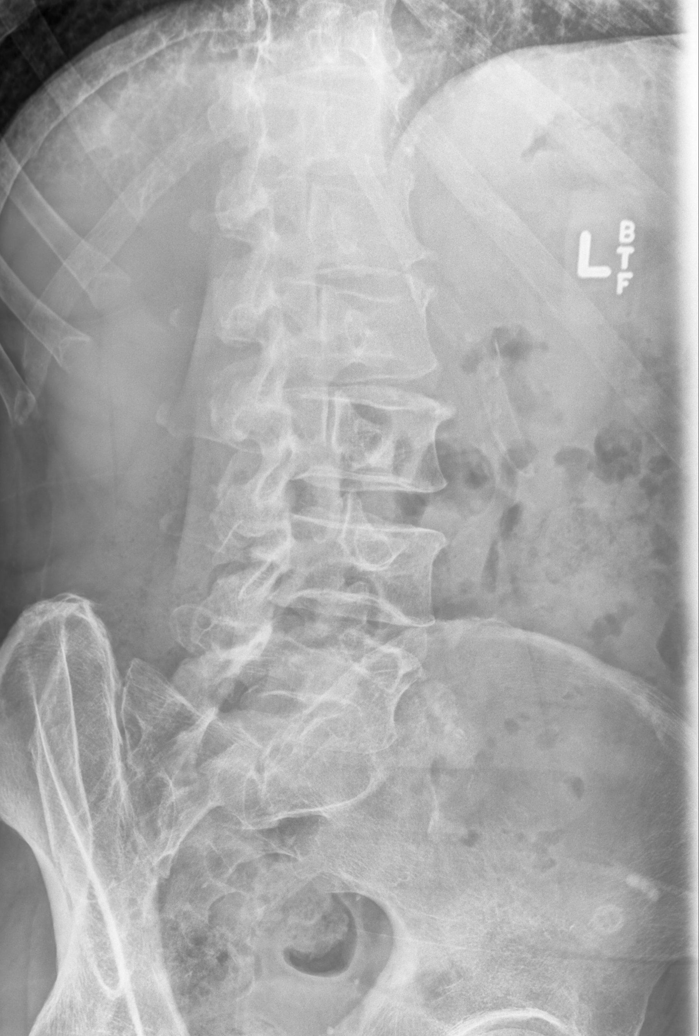

[lumbar spine lat (1 of 2)]
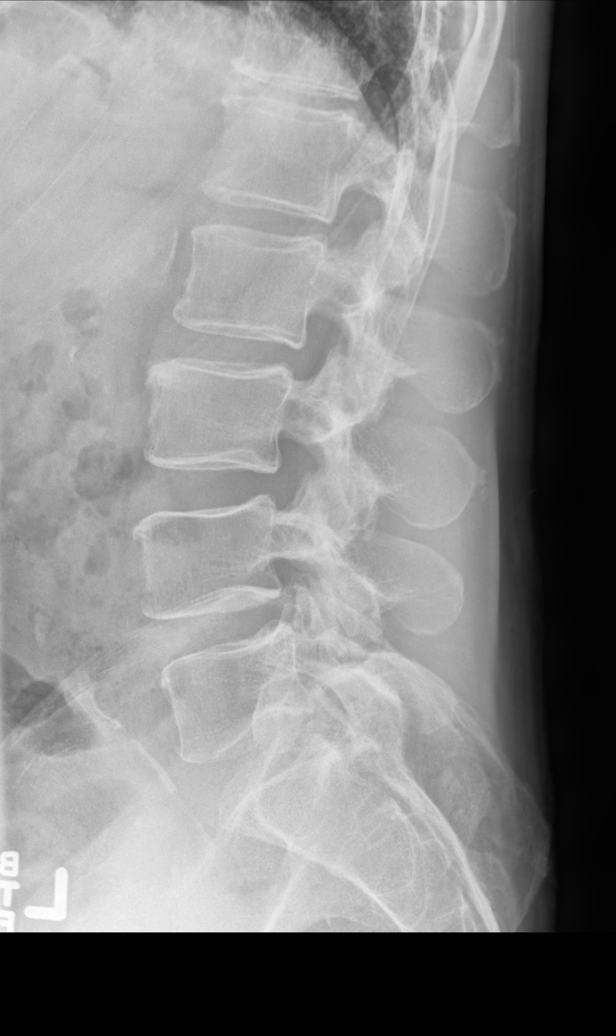

[lumbar spine lat (2 of 2)]
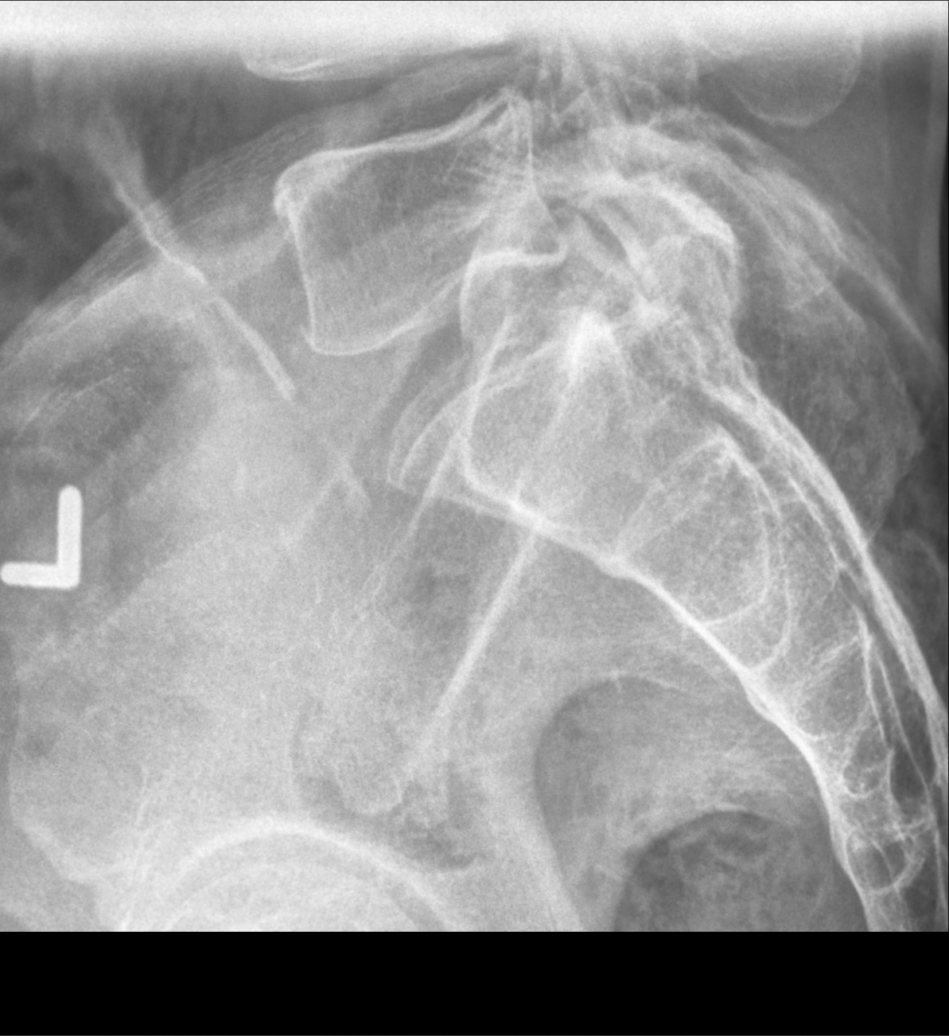

[5 of 5 positions shown; findings below may reference images not displayed]

FINDINGS: Five non rib-bearing lumbar type vertebral bodies are present.
Vertebral body heights and AP alignment are anatomic. Mild leftward
curvature is present in the mid lumbar spine. Atherosclerotic
changes are noted in the aorta and branch vessels without aneurysm.
IMPRESSION: Mild leftward curvature of the lumbar spine without acute
abnormality.

## 2020-04-08 IMAGING — CT CT ABDOMEN AND PELVIS WITH CONTRAST
1 of 3 series · 13 of 32 positions shown, 18 images · IV contrast (APPLIED)
Comparison: None.

CLINICAL DATA: Right lower quadrant and pelvic pain for the past 4
months.

EXAM:
CT ABDOMEN AND PELVIS WITH CONTRAST
TECHNIQUE: Multidetector CT imaging of the abdomen and pelvis was performed
using the standard protocol following bolus administration of
intravenous contrast.
CONTRAST:  100mL RBMD2C-2YY IOPAMIDOL (RBMD2C-2YY) INJECTION 61%

[Series 2: abd/pelvis w/cm · axial · 0.79mm/px · z∈[-505,-75]mm · 13 of 100 slices shown, 18 images]
[im 7/100  soft-tissue]
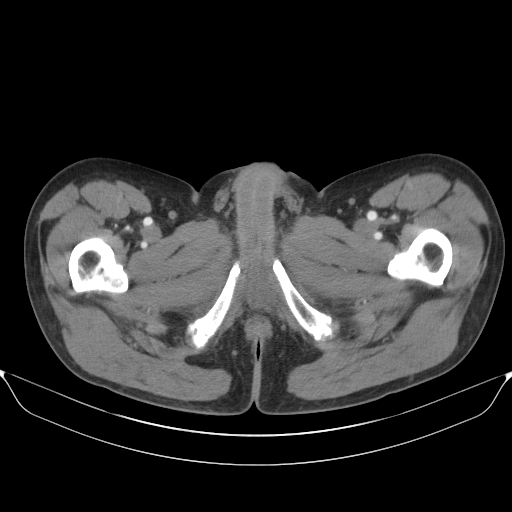
[im 7/100  bone]
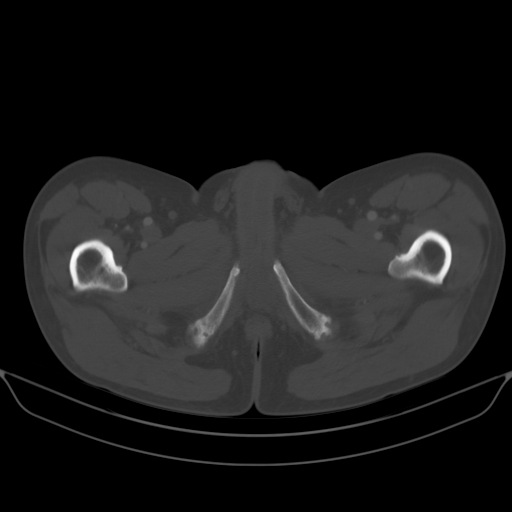
[im 13/100  soft-tissue]
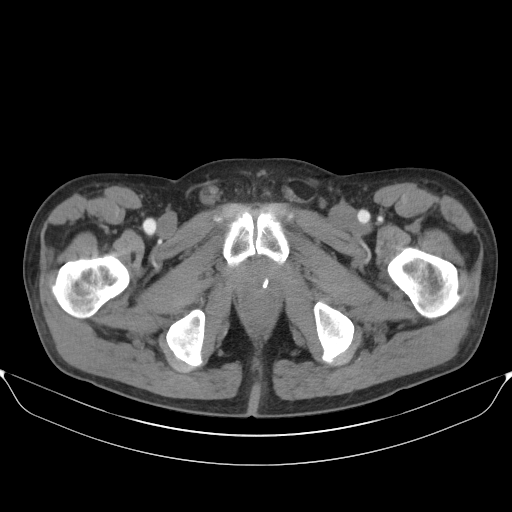
[im 25/100  soft-tissue]
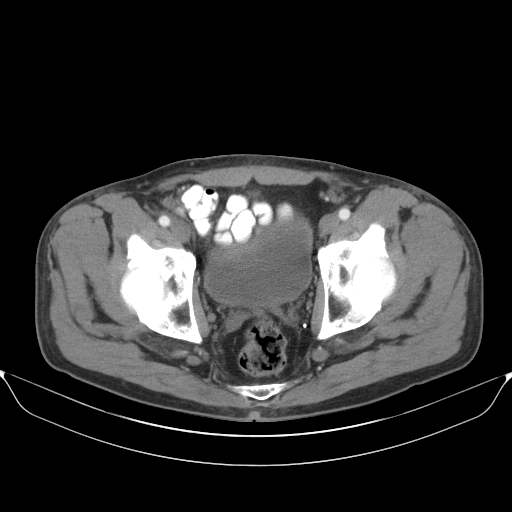
[im 31/100  soft-tissue]
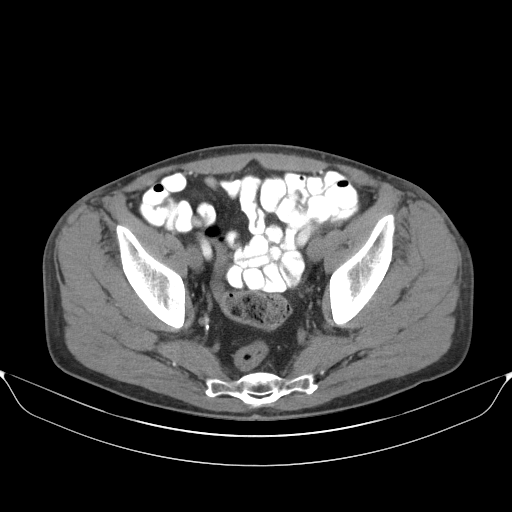
[im 38/100  soft-tissue]
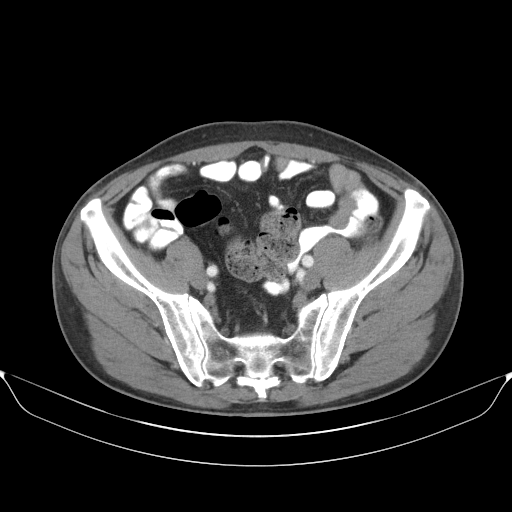
[im 44/100  soft-tissue]
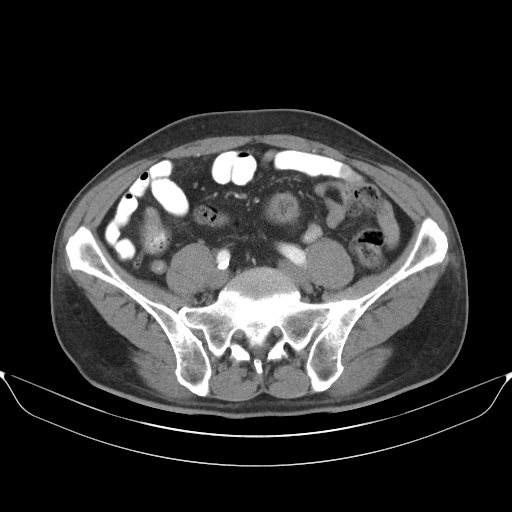
[im 56/100  soft-tissue]
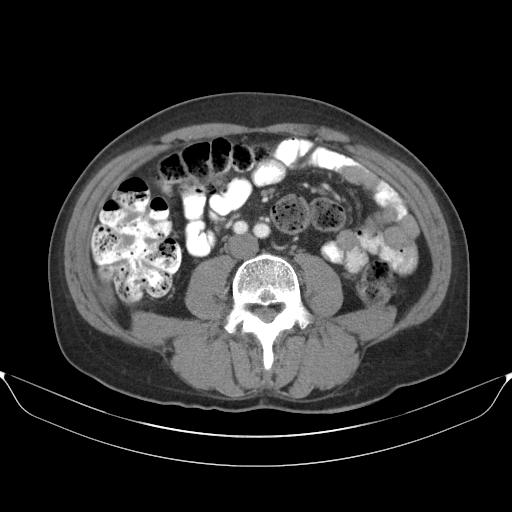
[im 62/100  soft-tissue]
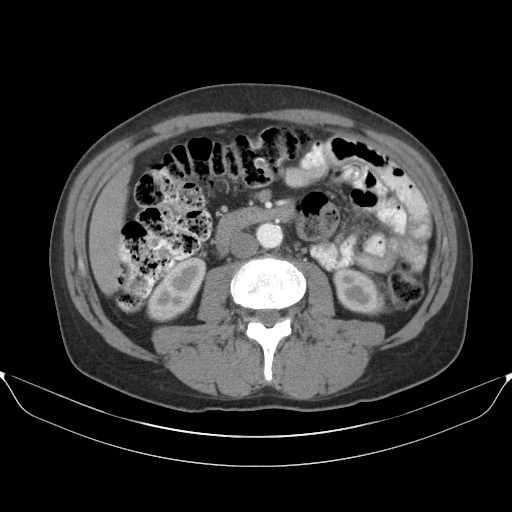
[im 69/100  soft-tissue]
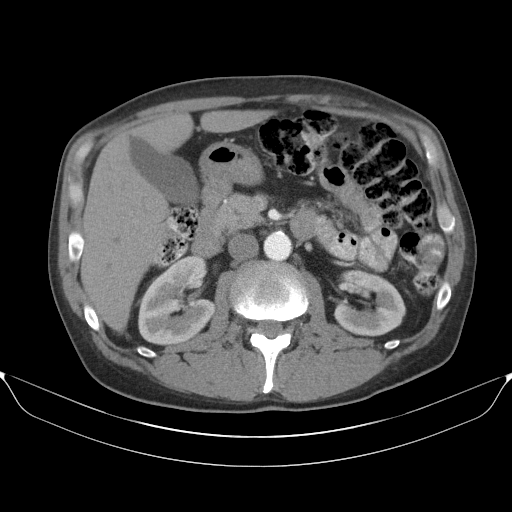
[im 69/100  bone]
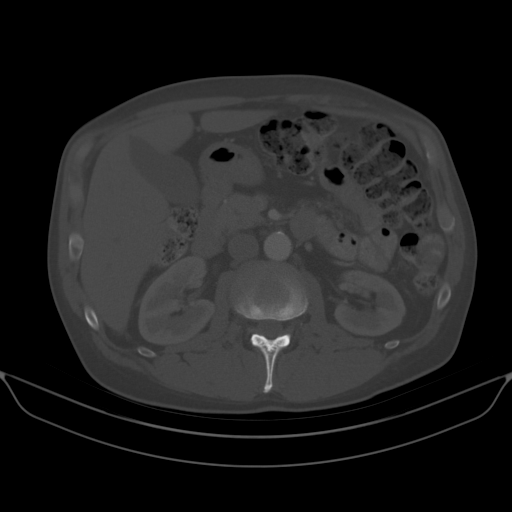
[im 75/100  soft-tissue]
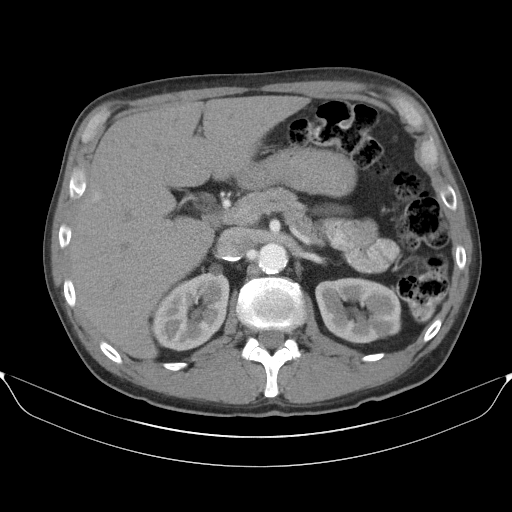
[im 75/100  lung]
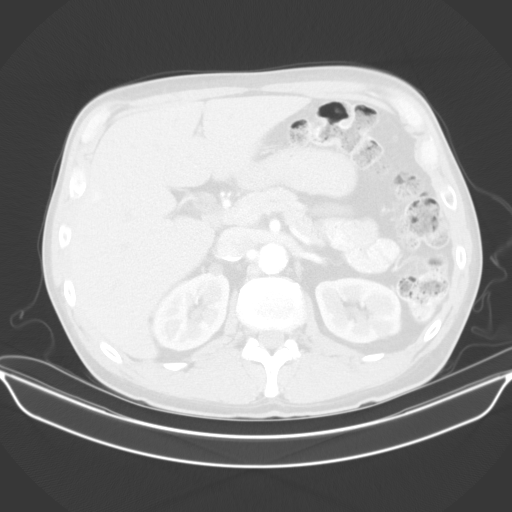
[im 81/100  lung]
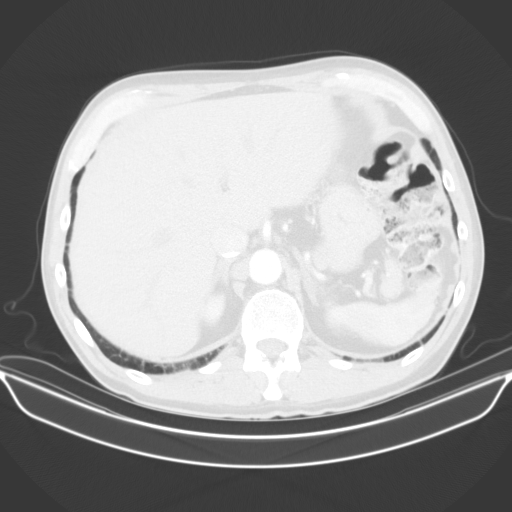
[im 87/100  soft-tissue]
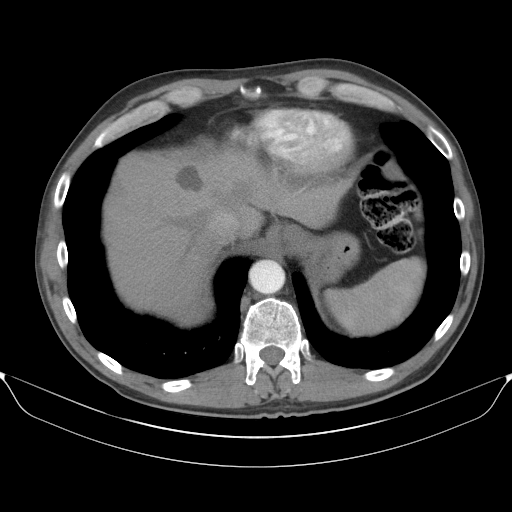
[im 87/100  lung]
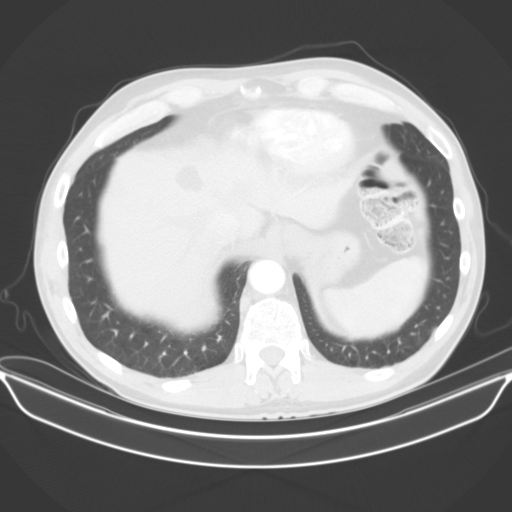
[im 93/100  soft-tissue]
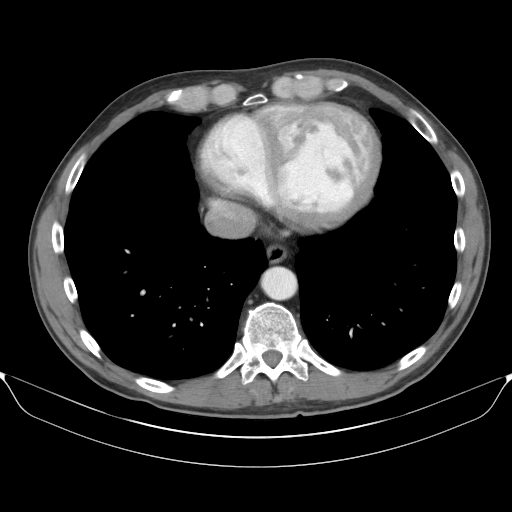
[im 93/100  lung]
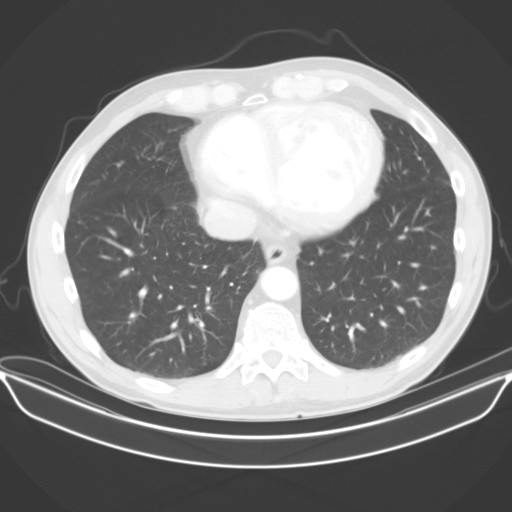

[13 of 32 positions shown; findings below may reference images not displayed]

FINDINGS: Lower chest: No acute abnormality. Cardiomegaly. Mild lower lobe
centrilobular and paraseptal emphysema.

Hepatobiliary: 2.3 cm simple cyst in segment 8. Additional 1.5 cm
simple cyst in segment 3. Tiny subcentimeter low-density lesion in
the anterior aspect of segment 2 is too small to characterize. The
gallbladder is unremarkable. No biliary dilatation.

Pancreas: Unremarkable. No pancreatic ductal dilatation or
surrounding inflammatory changes.

Spleen: Normal in size without focal abnormality.

Adrenals/Urinary Tract: 1.3 cm right adrenal adenoma. Left adrenal
gland thickening without discrete nodule. No renal mass. Focal
cortical scarring in the upper pole of the left kidney. No renal or
ureteral calculi. No hydronephrosis. Mild circumferential bladder
wall thickening.

Stomach/Bowel: Stomach is within normal limits. Appendix appears
normal. No evidence of bowel wall thickening, distention, or
inflammatory changes. Large amount of stool throughout the colon.

Vascular/Lymphatic: Aortic atherosclerosis. No enlarged abdominal or
pelvic lymph nodes.

Reproductive: Coarse central prostate calcifications.

Other: Small fat containing left indirect and right direct inguinal
hernias. No free fluid or pneumoperitoneum.

Musculoskeletal: No acute or significant osseous findings.
IMPRESSION: 1. Mild circumferential bladder wall thickening, potentially related
to underdistention, but can also be seen with cystitis. Correlation
with urinalysis is recommended.
2.  Prominent stool throughout the colon favors constipation.
3. 1.3 cm right adrenal adenoma.
4. Small fat containing bilateral inguinal hernias.

## 2020-04-17 ENCOUNTER — Other Ambulatory Visit: Payer: Self-pay | Admitting: Family Medicine

## 2020-06-11 DIAGNOSIS — Z7901 Long term (current) use of anticoagulants: Secondary | ICD-10-CM | POA: Insufficient documentation

## 2020-06-14 ENCOUNTER — Ambulatory Visit: Payer: 59 | Admitting: Family Medicine

## 2020-06-14 ENCOUNTER — Encounter: Payer: Self-pay | Admitting: Family Medicine

## 2020-06-14 ENCOUNTER — Other Ambulatory Visit: Payer: Self-pay

## 2020-06-14 VITALS — BP 160/74 | HR 60 | Temp 98.1°F | Resp 16 | Ht 76.0 in | Wt 199.6 lb

## 2020-06-14 DIAGNOSIS — N401 Enlarged prostate with lower urinary tract symptoms: Secondary | ICD-10-CM

## 2020-06-14 DIAGNOSIS — N138 Other obstructive and reflux uropathy: Secondary | ICD-10-CM

## 2020-06-14 NOTE — Progress Notes (Signed)
OFFICE VISIT  06/14/2020   CC:  Chief Complaint  Patient presents with  . Prostate pain    weak urine flow early in the morning, located in mid region   HPI:    Patient is a 54 y.o. Caucasian male who presents for urinary complaints. Has suprapub discomfort, difficulty getting urine going and emptying bladder in mornings but fine later in the day.  No prob with nocturia.  No daytime urgency or frequency.  Sx's not clearly connected to any particular food or drink.  Hard to tell how long but sounds like a few months.  No dysuria.   Not that bothered by it other than the fact that he fears prostate cancer and wants some reassurance. No fevers, no n/v, no constipation, no diarrhea.  Past Medical History:  Diagnosis Date  . Arthritis    foot  . Atrial fibrillation ([place])    09/2018: Dr. Gwenlyn Found referred him to a-fib clinic for consideration or redo CV or ablation. After eval by a-fib clinic, he was referred to [facility] to adult congenital specialist and EP: amiodarone started, cMRI to be repeated.  . 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  Also Systemic RV EF 41%, Mild systemic AR and mild/mod systemic AV valve regurg.  Normal fxn of subpulmonic LV.  Marland Kitchen Dysrhythmia    a-fib  . History of adenomatous polyp of colon 09/2019   recall 09/2024  . History of kidney stones    passed stone - no surgery  . Hypertension   . Kidney calculi    2001  . Migraines    occasional  . Pneumonia 10/2017  . Wears glasses     Past Surgical History:  Procedure Laterality Date  . CARDIAC SURGERY     correction of transposition of the great arteries noted on cardiac MRI done in the [place]., pt denies heart surgery  . CARDIOVERSION  03/2018   pt only briefly held sinus rhythm in [place]  . CARDIOVERSION N/A 01/12/2019   Procedure: CARDIOVERSION;  Surgeon: Sanda Klein, MD;  Location: [facility];  Service: Cardiovascular;  Laterality: N/A;  . COLONOSCOPY W/ POLYPECTOMY  09/2019   adenoma->recall 09/2024  . TRANSTHORACIC ECHOCARDIOGRAM     in the [place]-->pt cannot recall any results.  . TRANSTHORACIC ECHOCARDIOGRAM  09/2018   EF for both ventricles 25%, severe systemic AV (tricuspid) regurg--> pt needs gated CT angio and [facility].  . WISDOM TOOTH EXTRACTION      Outpatient Medications Prior to Visit  Medication Sig Dispense Refill  . amiodarone (PACERONE) 200 MG tablet Take 0.5 tablets by mouth daily.    . bisoprolol (ZEBETA) 10 MG tablet Take 1 tablet (10 mg total) by mouth daily. 90 tablet 3  . ELIQUIS 5 MG TABS tablet Take 1 tablet (5 mg total) by mouth 2 (two) times daily. 180 tablet 0  . lisinopril (ZESTRIL) 40 MG tablet Take 1 tablet (40 mg total) by mouth daily in the afternoon. 90 tablet 3  . Multiple Vitamin (MULTI-VITAMIN) tablet Take by mouth.     No facility-administered medications prior to visit.    No Known Allergies  ROS As per HPI  PE: Blood pressure (!) 160/74, pulse 60, temperature 98.1 F (36.7 C), temperature source Oral, resp. rate 16, height 6\' 4"  (1.93 m), weight 199 lb 9.6 oz (90.5 kg), SpO2 99 %. Gen:  Alert, well appearing.  Patient is oriented to person, place, time, and situation. AFFECT: pleasant, lucid thought and speech. CV: RRR, no m/r/g.   LUNGS: CTA bilat, nonlabored resps, good aeration in all lung fields. ABD: soft, NT, ND, BS normal.  No hepatospenomegaly or mass.  No bruits. EXT: no clubbing or cyanosis.  no edema.    LABS:    Chemistry      Component Value Date/Time   NA 137 01/03/2020 0928   K 4.8 01/03/2020 0928   CL 99 01/03/2020 0928   CO2 27 01/03/2020 0928   BUN 11 01/03/2020 0928   CREATININE 0.92 01/03/2020 0928      Component Value Date/Time   CALCIUM 9.6 01/03/2020 0928   ALKPHOS 33 (L) 08/03/2019 0955   AST 11 01/03/2020 0928   ALT 12 01/03/2020 0928   BILITOT 0.5 01/03/2020  0928     Lab Results  Component Value Date   PSA 0.65 08/03/2019    IMPRESSION AND PLAN:  1) BPH with LUT obstructive sx's. Mild.  I reassured him I have no suspicion that this is a sign of cancer. He'll consider trial of flomax in future and will send me a mychart message if he wants.  BP up: control good per home monitoring.  He has known white coat htn.  An After Visit Summary was printed and given to the patient.  FOLLOW UP: Return if symptoms worsen or fail to improve.  Signed:  Crissie Sickles, MD           06/14/2020

## 2020-07-25 ENCOUNTER — Other Ambulatory Visit: Payer: Self-pay | Admitting: Family Medicine

## 2020-08-09 ENCOUNTER — Ambulatory Visit: Payer: 59 | Admitting: Family Medicine

## 2020-08-16 ENCOUNTER — Encounter: Payer: Self-pay | Admitting: Family Medicine

## 2020-08-16 ENCOUNTER — Other Ambulatory Visit: Payer: Self-pay

## 2020-08-16 ENCOUNTER — Ambulatory Visit: Payer: 59 | Admitting: Family Medicine

## 2020-08-16 VITALS — BP 162/81 | HR 56 | Temp 97.7°F | Resp 16 | Ht 75.25 in | Wt 196.0 lb

## 2020-08-16 DIAGNOSIS — Z7901 Long term (current) use of anticoagulants: Secondary | ICD-10-CM

## 2020-08-16 DIAGNOSIS — Z8774 Personal history of (corrected) congenital malformations of heart and circulatory system: Secondary | ICD-10-CM

## 2020-08-16 DIAGNOSIS — I10 Essential (primary) hypertension: Secondary | ICD-10-CM

## 2020-08-16 DIAGNOSIS — M25562 Pain in left knee: Secondary | ICD-10-CM

## 2020-08-16 DIAGNOSIS — I482 Chronic atrial fibrillation, unspecified: Secondary | ICD-10-CM

## 2020-08-16 DIAGNOSIS — Z9889 Other specified postprocedural states: Secondary | ICD-10-CM | POA: Diagnosis not present

## 2020-08-16 DIAGNOSIS — R931 Abnormal findings on diagnostic imaging of heart and coronary circulation: Secondary | ICD-10-CM

## 2020-08-16 DIAGNOSIS — G4733 Obstructive sleep apnea (adult) (pediatric): Secondary | ICD-10-CM

## 2020-08-16 MED ORDER — LISINOPRIL 40 MG PO TABS
ORAL_TABLET | ORAL | 0 refills | Status: DC
Start: 2020-08-16 — End: 2021-04-07

## 2020-08-16 NOTE — Progress Notes (Signed)
Office Note 08/16/2020  CC:  Chief Complaint  Patient presents with  . Annual Exam    pt is not fasting  . Follow-up    hypertension    HPI:  Robert Braun is a 54 y.o. White male who is here for f/u hx of white coat HTN w/out dx of HTN. 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 He is on ACE-I for low EF. Taking amiodarone, bisoprolol, and eliquis for a-fib.  INTERIM HX: He does not check bp at home. Says he is nervous today, says bp prob up b/c of that. He takes lisinopril 40 mg qd.  Onset of L knee pain in medial joint line area about 2 mo ago.  Worse when flexing it significantly.  No locking up or giving way.  Some good days and some bad days. No known injury that triggered it. Gets a bit better as he moves around more during the day. No knee swelling or redness or warmth.  Says his heart MD at [facility] has recommended he ask me about getting a sleep study to r/o OSA. He has no excessive somnolence, no known witnessed apneic events in sleep. + hx of HTN of questionable control, + dec EF.  ROS: no fevers, no CP, no SOB, no wheezing, no cough, no dizziness, no HAs, no rashes, no melena/hematochezia.  No polyuria or polydipsia.  No myalgias.  No focal weakness, paresthesias, or tremors.  No acute vision or hearing abnormalities. No n/v/d or abd pain.  No palpitations.     Past Medical History:  Diagnosis Date  . Arthritis    foot  . Atrial fibrillation ([place])    09/2018: Dr. Gwenlyn Found referred him to a-fib clinic for consideration or redo CV or ablation. After eval by a-fib clinic, he was referred to [facility] to adult congenital specialist and EP: amiodarone started, cMRI to be repeated.  . 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  Also Systemic RV EF 41%, Mild systemic AR and  mild/mod systemic AV valve regurg.  Normal fxn of subpulmonic LV.  Marland Kitchen Dysrhythmia    a-fib  . History of adenomatous polyp of colon 09/2019   recall 09/2024  . History of kidney stones    passed stone - no surgery  . Hypertension   . Kidney calculi    2001  . Migraines    occasional  . Pneumonia 10/2017  . Wears glasses     Past Surgical History:  Procedure Laterality Date  . CARDIAC SURGERY     correction of transposition of the great arteries noted on cardiac MRI done in the [place]., pt denies heart surgery  . CARDIOVERSION  03/2018   pt only briefly held sinus rhythm in [place]  . CARDIOVERSION N/A 01/12/2019   Procedure: CARDIOVERSION;  Surgeon: Sanda Klein, MD;  Location: [facility];  Service: Cardiovascular;  Laterality: N/A;  . COLONOSCOPY W/ POLYPECTOMY  09/2019   adenoma->recall 09/2024  . TRANSTHORACIC ECHOCARDIOGRAM     in the [place]-->pt cannot recall any results.  . TRANSTHORACIC ECHOCARDIOGRAM  09/2018   EF for both ventricles 25%, severe systemic AV (tricuspid) regurg--> pt needs gated CT angio and [facility]  . WISDOM TOOTH EXTRACTION      Family History  Problem Relation Age of Onset  . Parkinson's disease  Mother   . Hypertension Father   . Colon cancer Neg Hx   . Esophageal cancer Neg Hx   . Rectal cancer Neg Hx   . Stomach cancer Neg Hx     Social History   Socioeconomic History  . Marital status: Married    Spouse name: Not on file  . Number of children: Not on file  . Years of education: Not on file  . Highest education level: Not on file  Occupational History  . Not on file  Tobacco Use  . Smoking status: Former Smoker    Years: 31.00    Types: Cigarettes    Quit date: 11/19/2013    Years since quitting: 6.7  . Smokeless tobacco: Never Used  Vaping Use  . Vaping Use: Every day  Substance and Sexual Activity  . Alcohol use: Yes    Alcohol/week: 4.0 - 8.0 standard drinks    Types: 4 - 8 Glasses of wine per week     Comment: 1-2 bottles wine on Weekends (Fri-Sun)  . Drug use: Never  . Sexual activity: Not on file  Other Topics Concern  . Not on file  Social History Narrative   Married, 1 daughter.   Orig from [place].   Lived in [place] 20 yrs, then moved to [facility] 04/2018.   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   Smoker until 2015-->switched to vap.   Alcohol: some beer and wine several days per week--sometimes >3 glasses of wine.   No drugs.   Social Determinants of Health   Financial Resource Strain:   . Difficulty of Paying Living Expenses: Not on file  Food Insecurity:   . Worried About Charity fundraiser in the Last Year: Not on file  . Ran Out of Food in the Last Year: Not on file  Transportation Needs:   . Lack of Transportation (Medical): Not on file  . Lack of Transportation (Non-Medical): Not on file  Physical Activity:   . Days of Exercise per Week: Not on file  . Minutes of Exercise per Session: Not on file  Stress:   . Feeling of Stress : Not on file  Social Connections:   . Frequency of Communication with Friends and Family: Not on file  . Frequency of Social Gatherings with Friends and Family: Not on file  . Attends Religious Services: Not on file  . Active Member of Clubs or Organizations: Not on file  . Attends Archivist Meetings: Not on file  . Marital Status: Not on file  Intimate Partner Violence:   . Fear of Current or Ex-Partner: Not on file  . Emotionally Abused: Not on file  . Physically Abused: Not on file  . Sexually Abused: Not on file    Outpatient Medications Prior to Visit  Medication Sig Dispense Refill  . amiodarone (PACERONE) 200 MG tablet Take 0.5 tablets by mouth daily.    . bisoprolol (ZEBETA) 10 MG tablet Take 1 tablet (10 mg total) by mouth daily. 90 tablet 3  . ELIQUIS 5 MG TABS tablet Take 1 tablet (5 mg total) by mouth 2 (two) times daily. 180 tablet 0  . Multiple Vitamin (MULTI-VITAMIN) tablet Take by mouth.    Marland Kitchen  lisinopril (ZESTRIL) 40 MG tablet TAKE 1 TABLET (40 MG TOTAL) BY MOUTH DAILY IN THE AFTERNOON.**DOSE INCREASE** 30 tablet 0   No facility-administered medications prior to visit.    No Known Allergies  PE; Vitals with BMI 08/16/2020  06/14/2020 09/19/2019  Height 6' 3.25" 6\' 4"  -  Weight 196 lbs 199 lbs 10 oz -  BMI 30.16 01.09 -  Systolic 323 557 322  Diastolic 81 74 64  Pulse 56 60 48   Gen: Alert, well appearing.  Patient is oriented to person, place, time, and situation. AFFECT: pleasant, lucid thought and speech. L knee: no erythema, warmth, or swelling.  ROM fully intact w/out pain, no instability or pain with testing.  No focal joint line TTP.  McMurray's neg.  Patellar grind neg.    Pertinent labs:  Lab Results  Component Value Date   TSH 1.09 01/03/2020   Lab Results  Component Value Date   WBC 7.8 08/03/2019   HGB 13.1 08/03/2019   HCT 38.9 (L) 08/03/2019   MCV 96.4 08/03/2019   PLT 234.0 08/03/2019   Lab Results  Component Value Date   CREATININE 0.92 01/03/2020   BUN 11 01/03/2020   NA 137 01/03/2020   K 4.8 01/03/2020   CL 99 01/03/2020   CO2 27 01/03/2020   Lab Results  Component Value Date   ALT 12 01/03/2020   AST 11 01/03/2020   ALKPHOS 33 (L) 08/03/2019   BILITOT 0.5 01/03/2020   Lab Results  Component Value Date   CHOL 189 08/03/2019   Lab Results  Component Value Date   HDL 70.80 08/03/2019   Lab Results  Component Value Date   LDLCALC 97 08/03/2019   Lab Results  Component Value Date   TRIG 104.0 08/03/2019   Lab Results  Component Value Date   CHOLHDL 3 08/03/2019   Lab Results  Component Value Date   PSA 0.65 08/03/2019    ASSESSMENT AND PLAN:   1) HTN: cont lisinopril, 90d rx done today, but he wants to switch mgmt and ongoing RFs for this med to his cardiologist in [place].  This would be more convenient for him. Pt prefers no change in med today but was reminded of the importance of monitoring bp at home to have some  more data to go on.  2) L knee pain: unknown etiology.  Normal knee exam today. No red flags to prompt any certain treatment or w/u.   However, he does want to see orthopedist for further eval. Referral ordered today.  3) OSA testing: suspect his cardiologist is wanting to screen for this to see if undiagnosed sleep apnea could be contributing to poor bp control + affecting his cardiomyopathy. He wants to hold off on referral for this right now but will call/send mychart message if he wants this before next f/u for his cpe in 6 mo.  4) Hx of transposition of great arteries and subsequent surgical correction. 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 He is asymptomatic. No change in meds for this today (BB, ACE-I).  5) Chronic a-fib: on amio, BB, and eliquis--as per cards.  An After Visit Summary was printed and given to the patient.  FOLLOW UP:  Return in about 6 months (around 02/14/2021) for annual CPE (fasting).  Signed:  Crissie Sickles, MD           08/16/2020

## 2020-10-13 ENCOUNTER — Other Ambulatory Visit: Payer: Self-pay | Admitting: Family Medicine

## 2021-01-13 ENCOUNTER — Other Ambulatory Visit: Payer: Self-pay | Admitting: Family Medicine

## 2021-01-18 ENCOUNTER — Encounter (HOSPITAL_BASED_OUTPATIENT_CLINIC_OR_DEPARTMENT_OTHER): Payer: Self-pay | Admitting: Emergency Medicine

## 2021-01-18 ENCOUNTER — Other Ambulatory Visit: Payer: Self-pay

## 2021-01-18 DIAGNOSIS — Z7901 Long term (current) use of anticoagulants: Secondary | ICD-10-CM | POA: Insufficient documentation

## 2021-01-18 DIAGNOSIS — Z79899 Other long term (current) drug therapy: Secondary | ICD-10-CM | POA: Diagnosis not present

## 2021-01-18 DIAGNOSIS — Z87891 Personal history of nicotine dependence: Secondary | ICD-10-CM | POA: Insufficient documentation

## 2021-01-18 DIAGNOSIS — I10 Essential (primary) hypertension: Secondary | ICD-10-CM | POA: Diagnosis not present

## 2021-01-18 DIAGNOSIS — T63001A Toxic effect of unspecified snake venom, accidental (unintentional), initial encounter: Secondary | ICD-10-CM | POA: Insufficient documentation

## 2021-01-18 NOTE — ED Triage Notes (Signed)
Bite mark noted to right inner ankle.  Reports he went to take the trash out and noticed itching to ankle when he came inside.  Noticed two bite marks to sock then to ankle underneath.  Denies having any pain just c/o itching.

## 2021-01-19 ENCOUNTER — Emergency Department (HOSPITAL_BASED_OUTPATIENT_CLINIC_OR_DEPARTMENT_OTHER)
Admission: EM | Admit: 2021-01-19 | Discharge: 2021-01-19 | Disposition: A | Discharge status: Home / Self Care | Payer: 59 | Attending: Emergency Medicine | Admitting: Emergency Medicine

## 2021-01-19 DIAGNOSIS — W5911XA Bitten by nonvenomous snake, initial encounter: Secondary | ICD-10-CM

## 2021-01-19 NOTE — ED Provider Notes (Signed)
[facility] Provider Note: Georgena Spurling, MD, FACEP  CSN: [id number] MRN: [id number] ARRIVAL: 01/18/21 at 2336 ROOM: [place] PRESENT ILLNESS  01/19/21 2:34 AM Robert Braun is a 55 y.o. male who was gardening about 10:30 PM yesterday evening and thinks something bit his medial right ankle he noticed to marks which appear to be puncture wounds about a quarter of an inch apart.  They do not hurt and there is no associated erythema or swelling but they do itch.   Past Medical History:  Diagnosis Date  . Arthritis    foot  . Atrial fibrillation ([place])    09/2018: Dr. Gwenlyn Found referred him to a-fib clinic for consideration or redo CV or ablation. After eval by a-fib clinic, he was referred to [facility] to adult congenital specialist and EP: amiodarone started, cMRI to be repeated.  . 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  Also Systemic RV EF 41%, Mild systemic AR and mild/mod systemic AV valve regurg.  Normal fxn of subpulmonic LV.  Marland Kitchen Dysrhythmia    a-fib  . History of adenomatous polyp of colon 09/2019   recall 09/2024  . History of kidney stones    passed stone - no surgery  . Hypertension   . Kidney calculi    2001  . Migraines    occasional  . Pneumonia 10/2017  . Wears glasses     Past Surgical History:  Procedure Laterality Date  . CARDIAC SURGERY     correction of transposition of the great arteries noted on cardiac MRI done in the [place]., pt denies heart surgery  . CARDIOVERSION  03/2018   pt only briefly held sinus rhythm in [place]  . CARDIOVERSION N/A 01/12/2019   Procedure: CARDIOVERSION;  Surgeon: Sanda Klein, MD;  Location: [facility];  Service: Cardiovascular;  Laterality: N/A;  . COLONOSCOPY W/ POLYPECTOMY  09/2019   adenoma->recall 09/2024  . TRANSTHORACIC ECHOCARDIOGRAM     [place] any results.  . TRANSTHORACIC ECHOCARDIOGRAM  09/2018   EF for both ventricles 25%, severe systemic AV (tricuspid) regurg--> pt needs gated CT angio and [facility].  . WISDOM TOOTH EXTRACTION      Family History  Problem Relation Age of Onset  . Parkinson's disease Mother   . Hypertension Father   . Colon cancer Neg Hx   . Esophageal cancer Neg Hx   . Rectal cancer Neg Hx   . Stomach cancer Neg Hx     Social History   Tobacco Use  . Smoking status: Former Smoker    Years: 31.00    Types: Cigarettes    Quit date: 11/19/2013    Years since quitting: 7.1  . Smokeless tobacco: Never Used  Vaping Use  . Vaping Use: Every day  Substance Use Topics  . Alcohol use: Yes    Alcohol/week: 4.0 - 8.0 standard drinks    Types: 4 - 8 Glasses of wine per week    Comment: 1-2 bottles wine on Weekends (Fri-Sun)  . Drug use: Never    Prior to Admission medications   Medication Sig Start Date End Date Taking? Authorizing Provider  amiodarone (PACERONE) 200 MG tablet Take 0.5 tablets by mouth daily. 09/15/19   [provider]  bisoprolol (ZEBETA) 10 MG tablet TAKE 1  TABLET BY MOUTH EVERY DAY 01/13/21   McGowen, Adrian Blackwater, MD  ELIQUIS 5 MG TABS tablet Take 1 tablet (5 mg total) by mouth 2 (two) times daily. 03/28/19   McGowen, Adrian Blackwater, MD  lisinopril (ZESTRIL) 40 MG tablet TAKE 1 TABLET (40 MG TOTAL) BY MOUTH DAILY IN THE AFTERNOON. 08/16/20   McGowen, Adrian Blackwater, MD  Multiple Vitamin (MULTI-VITAMIN) tablet Take by mouth.    [provider]    Allergies Patient has no known allergies.   REVIEW OF SYSTEMS  Negative except as noted here or in the History of Present Illness.   PHYSICAL EXAMINATION  Initial Vital Signs Blood pressure (!) 150/84, pulse 62, temperature 98.2 F (36.8 C), temperature source Oral, resp. rate 18, height 6' 3.25" (1.911 m), weight 90 kg, SpO2 100 %.  Examination General: Well-developed, well-nourished  male in no acute distress; appearance consistent with age of record HENT: normocephalic; atraumatic Eyes: Normal appearance Neck: supple Heart: regular rate and rhythm Lungs: clear to auscultation bilaterally Abdomen: soft; nondistended; nontender; bowel sounds present Extremities: No deformity; full range of motion; pulses normal Neurologic: Awake, alert and oriented; motor function intact in all extremities and symmetric; no facial droop Skin: Warm and dry; 2 superficial, shallow puncture wounds of medial right ankle without surrounding erythema, ecchymosis or edema Psychiatric: Normal mood and affect   RESULTS  Summary of this visit's results, reviewed and interpreted by myself:   EKG Interpretation  Date/Time:    Ventricular Rate:    PR Interval:    QRS Duration:   QT Interval:    QTC Calculation:   R Axis:     Text Interpretation:        Laboratory Studies: No results found for this or any previous visit (from the past 24 hour(s)). Imaging Studies: No results found.  ED COURSE and MDM  Nursing notes, initial and subsequent vitals signs, including pulse oximetry, reviewed and interpreted by myself.  Vitals:   01/18/21 2355  BP: (!) 150/84  Pulse: 62  Resp: 18  Temp: 98.2 F (36.8 C)  TempSrc: Oral  SpO2: 100%  Weight: 90 kg  Height: 6' 3.25" (1.911 m)   Medications - No data to display  Suspected bite by juvenile Crow talented but without envenomation.  As juvenile cartel is usually fully inventive made I suspect the bite was too shallow for the snake to inject the [facility].  There is no evidence of any venom reaction at the site.  PROCEDURES  Procedures   ED DIAGNOSES     ICD-10-CM   1. Bite, snake, initial encounter  W59.11XA        Finnegan Gatta, Jenny Reichmann, MD 01/19/21 [phone]

## 2021-01-28 ENCOUNTER — Telehealth: Payer: Self-pay | Admitting: Family Medicine

## 2021-01-28 NOTE — Telephone Encounter (Signed)
Disregard. Traci from [facility] [facility] wanted to leave a message for the patient (initially stated she wanted to speak to the doctor). She is a case worker and has been trying to contact the patient. If interested, her callback info is [phone]

## 2021-02-08 ENCOUNTER — Other Ambulatory Visit: Payer: Self-pay | Admitting: Family Medicine

## 2021-02-17 ENCOUNTER — Emergency Department (HOSPITAL_COMMUNITY)
Admission: EM | Admit: 2021-02-17 | Discharge: 2021-02-18 | Disposition: A | Discharge status: Home / Self Care | Payer: 59 | Attending: Emergency Medicine | Admitting: Emergency Medicine

## 2021-02-17 ENCOUNTER — Encounter (HOSPITAL_COMMUNITY): Payer: Self-pay

## 2021-02-17 ENCOUNTER — Other Ambulatory Visit: Payer: Self-pay

## 2021-02-17 DIAGNOSIS — I10 Essential (primary) hypertension: Secondary | ICD-10-CM | POA: Insufficient documentation

## 2021-02-17 DIAGNOSIS — Z87891 Personal history of nicotine dependence: Secondary | ICD-10-CM | POA: Diagnosis not present

## 2021-02-17 DIAGNOSIS — Z79899 Other long term (current) drug therapy: Secondary | ICD-10-CM | POA: Insufficient documentation

## 2021-02-17 DIAGNOSIS — R112 Nausea with vomiting, unspecified: Secondary | ICD-10-CM

## 2021-02-17 DIAGNOSIS — R1084 Generalized abdominal pain: Secondary | ICD-10-CM | POA: Insufficient documentation

## 2021-02-17 DIAGNOSIS — R109 Unspecified abdominal pain: Secondary | ICD-10-CM | POA: Diagnosis present

## 2021-02-17 DIAGNOSIS — Z7901 Long term (current) use of anticoagulants: Secondary | ICD-10-CM | POA: Insufficient documentation

## 2021-02-17 LAB — COMPREHENSIVE METABOLIC PANEL
ALT: 33 U/L (ref 0–44)
AST: 39 U/L (ref 15–41)
Albumin: 4.6 g/dL (ref 3.5–5.0)
Alkaline Phosphatase: 33 U/L — ABNORMAL LOW (ref 38–126)
Anion gap: 8 (ref 5–15)
BUN: 17 mg/dL (ref 6–20)
CO2: 26 mmol/L (ref 22–32)
Calcium: 8.9 mg/dL (ref 8.9–10.3)
Chloride: 99 mmol/L (ref 98–111)
Creatinine, Ser: 0.91 mg/dL (ref 0.61–1.24)
GFR, Estimated: >60 mL/min (ref >60)
Glucose, Bld: 132 mg/dL — ABNORMAL HIGH (ref 70–99)
Potassium: 3.8 mmol/L (ref 3.5–5.1)
Sodium: 133 mmol/L — ABNORMAL LOW (ref 135–145)
Total Bilirubin: 0.8 mg/dL (ref 0.3–1.2)
Total Protein: 7.2 g/dL (ref 6.5–8.1)

## 2021-02-17 LAB — CBC
HCT: 38.5 % — ABNORMAL LOW (ref 39.0–52.0)
Hemoglobin: 12.9 g/dL — ABNORMAL LOW (ref 13.0–17.0)
MCH: 31.8 pg (ref 26.0–34.0)
MCHC: 33.5 g/dL (ref 30.0–36.0)
MCV: 94.8 fL (ref 80.0–100.0)
Platelets: 257 10*3/uL (ref 150–400)
RBC: 4.06 MIL/uL — ABNORMAL LOW (ref 4.22–5.81)
RDW: 12.1 % (ref 11.5–15.5)
WBC: 11.6 10*3/uL — ABNORMAL HIGH (ref 4.0–10.5)
nRBC: 0 % (ref 0.0–0.2)

## 2021-02-17 LAB — LIPASE, BLOOD: Lipase: 43 U/L (ref 11–51)

## 2021-02-17 MED ORDER — ONDANSETRON 4 MG PO TBDP: 4.0000 mg | ORAL_TABLET | Freq: Once | ORAL | Status: DC | PRN

## 2021-02-17 NOTE — ED Triage Notes (Addendum)
Pt c/o sudden onset RLQ abd pain starting after eating dinner around 6:30p, states he thinks the chicken he ate with dinner may have been undercooked but isn't sure. Patient vomited x2 during triage. States it radiates into back

## 2021-02-17 NOTE — ED Provider Notes (Signed)
MSE was initiated and I personally evaluated the patient and placed orders (if any) at  10:45 PM on Feb 17, 2021.  The patient appears stable so that the remainder of the MSE may be completed by another provider.   55 year old male here with umbilical abdominal pain that started earlier this evening after he ate dinner.  Thought it may be due to food poisoning but states pain is moderate and associate with nausea and vomiting.  On exam he has a fairly benign abdominal exam, heart and lungs are of normal rate and rhythm and lungs are clear to auscultation.  Patient is resting comfortably.   Domenic Moras, PA-C 02/17/21 2247    Davonna Belling, MD 02/18/21 Laureen Abrahams

## 2021-02-18 MED ORDER — ONDANSETRON 4 MG PO TBDP: 4.0000 mg | ORAL_TABLET | Freq: Three times a day (TID) | ORAL | 0 refills | Status: DC | PRN

## 2021-02-18 NOTE — ED Provider Notes (Signed)
[facility] DEPT Provider Note   CSN: [id number] Arrival date & time: 02/17/21  2144     History Chief Complaint  Patient presents with  . Abdominal Pain    Robert Braun is a 55 y.o. male.  The history is provided by the patient and medical records.  Abdominal Pain Associated symptoms: nausea and vomiting    55 year old male with history of congenital transposition of great arteries, A. fib, hypertension, presenting to the ED with abdominal pain.  States this began immediately after eating dinner last evening.  States he ate leftover takeout from a restaurant they frequent often.  States he noticed some of the chicken seemed "quite pink" but he ate it anyway.  States almost immediately after he finished his morning meal he had intense, mid abdominal pain.  He did have 2 episodes of emesis.  Patient unfortunately with a bit of a wait in the lobby and has been resting out there.  By time of my evaluation he states he is completely pain-free.  He has not had any further emesis or diarrhea since arrival in the ED.  Past Medical History:  Diagnosis Date  . Arthritis    foot  . Atrial fibrillation ([place])    09/2018: Dr. Gwenlyn Found referred him to a-fib clinic for consideration or redo CV or ablation. After eval by a-fib clinic, he was referred to [facility] to adult congenital specialist and EP: amiodarone started, cMRI to be repeated.  . 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  Also Systemic RV EF 41%, Mild systemic AR and mild/mod systemic AV valve regurg.  Normal fxn of subpulmonic LV.  Marland Kitchen Dysrhythmia    a-fib  . History of adenomatous polyp of colon 09/2019   recall 09/2024  . History of kidney stones    passed stone - no surgery  . Hypertension   . Kidney calculi    2001  . Migraines    occasional  . Pneumonia 10/2017  . Wears  glasses     Patient Active Problem List   Diagnosis Date Noted  . Chronic anticoagulation 06/11/2020  . Cardiomyopathy ([place]) 11/25/2018  . Congenital tricuspid regurgitation 11/22/2018  . History of migraine 11/21/2018  . History of congenital heart defect 11/21/2018  . Persistent atrial fibrillation ([place]) 10/04/2018  . Adult congenital heart disease 10/04/2018    Past Surgical History:  Procedure Laterality Date  . CARDIAC SURGERY     correction of transposition of the great arteries noted on cardiac MRI done in the [place]., pt denies heart surgery  . CARDIOVERSION  03/2018   pt only briefly held sinus rhythm in [place]  . CARDIOVERSION N/A 01/12/2019   Procedure: CARDIOVERSION;  Surgeon: Sanda Klein, MD;  Location: [facility];  Service: Cardiovascular;  Laterality: N/A;  . COLONOSCOPY W/ POLYPECTOMY  09/2019   adenoma->recall 09/2024  . TRANSTHORACIC ECHOCARDIOGRAM     in the [place]-->pt cannot recall any results.  . TRANSTHORACIC ECHOCARDIOGRAM  09/2018   EF for both ventricles 25%, severe systemic AV (tricuspid) regurg--> pt needs gated CT angio and [facility]  . WISDOM TOOTH EXTRACTION         Family History  Problem Relation Age of Onset  . Parkinson's disease Mother   . Hypertension Father   . Colon cancer Neg Hx   . Esophageal cancer Neg Hx   .  Rectal cancer Neg Hx   . Stomach cancer Neg Hx     Social History   Tobacco Use  . Smoking status: Former Smoker    Years: 31.00    Types: Cigarettes    Quit date: 11/19/2013    Years since quitting: 7.2  . Smokeless tobacco: Never Used  Vaping Use  . Vaping Use: Every day  Substance Use Topics  . Alcohol use: Yes    Alcohol/week: 4.0 - 8.0 standard drinks    Types: 4 - 8 Glasses of wine per week    Comment: 1-2 bottles wine on Weekends (Fri-Sun)  . Drug use: Never    Home Medications Prior to Admission medications   Medication Sig Start Date End Date Taking? Authorizing Provider   amiodarone (PACERONE) 200 MG tablet Take 0.5 tablets by mouth daily. 09/15/19   [provider]  bisoprolol (ZEBETA) 10 MG tablet TAKE 1 TABLET BY MOUTH EVERY DAY 02/10/21   McGowen, Adrian Blackwater, MD  ELIQUIS 5 MG TABS tablet Take 1 tablet (5 mg total) by mouth 2 (two) times daily. 03/28/19   McGowen, Adrian Blackwater, MD  lisinopril (ZESTRIL) 40 MG tablet TAKE 1 TABLET (40 MG TOTAL) BY MOUTH DAILY IN THE AFTERNOON. 08/16/20   McGowen, Adrian Blackwater, MD  Multiple Vitamin (MULTI-VITAMIN) tablet Take by mouth.    [provider]    Allergies    Patient has no known allergies.  Review of Systems   Review of Systems  Gastrointestinal: Positive for abdominal pain, nausea and vomiting.  All other systems reviewed and are negative.   Physical Exam Updated Vital Signs BP (!) 148/88 (BP Location: Left Arm)   Pulse 71   Temp 98.8 F (37.1 C) (Oral)   Resp 19   Ht 6' 3.59" (1.92 m)   Wt 90 kg   SpO2 100%   BMI 24.41 kg/m   Physical Exam Vitals and nursing note reviewed.  Constitutional:      Appearance: He is well-developed.     Comments: Very comfortable appearing  HENT:     Head: Normocephalic and atraumatic.  Eyes:     Conjunctiva/sclera: Conjunctivae normal.     Pupils: Pupils are equal, round, and reactive to light.  Cardiovascular:     Rate and Rhythm: Normal rate and regular rhythm.     Heart sounds: Normal heart sounds.  Pulmonary:     Effort: Pulmonary effort is normal.     Breath sounds: Normal breath sounds.  Abdominal:     General: Bowel sounds are normal.     Palpations: Abdomen is soft.     Tenderness: There is no abdominal tenderness. There is no rebound.     Comments: Soft, non-tender, normal bowel sounds  Musculoskeletal:        General: Normal range of motion.     Cervical back: Normal range of motion.  Skin:    General: Skin is warm and dry.  Neurological:     Mental Status: He is alert and oriented to person, place, and time.     ED Results /  Procedures / Treatments   Labs (all labs ordered are listed, but only abnormal results are displayed) Labs Reviewed  COMPREHENSIVE METABOLIC PANEL - Abnormal; Notable for the following components:      Result Value   Sodium 133 (*)    Glucose, Bld 132 (*)    Alkaline Phosphatase 33 (*)    All other components within normal limits  CBC - Abnormal; Notable for  the following components:   WBC 11.6 (*)    RBC 4.06 (*)    Hemoglobin 12.9 (*)    HCT 38.5 (*)    All other components within normal limits  LIPASE, BLOOD  URINALYSIS, ROUTINE W REFLEX MICROSCOPIC    EKG None  Radiology No results found.  Procedures Procedures   Medications Ordered in ED Medications  ondansetron (ZOFRAN-ODT) disintegrating tablet 4 mg (has no administration in time range)    ED Course  I have reviewed the triage vital signs and the nursing notes.  Pertinent labs & imaging results that were available during my care of the patient were reviewed by me and considered in my medical decision making (see chart for details).    MDM Rules/Calculators/A&P    55 y.o. male presenting to the ED with abdominal pain.  This began after eating what sounds like undercooked chicken from [employer]  States he vomited twice at home.  Patient did have a bit of a wait in the lobby and has been resting there.  By time of my evaluation all symptoms have resolved.  He has not had any further emesis and has no pain whatsoever now.  He is afebrile and nontoxic.  He appears very comfortable.  His abdomen is soft and nontender to palpation.  No peritoneal signs.  Minimal leukocytosis, may be reactive from the vomiting.  Electrolytes are reassuring.  Symptoms do seem food related.  As patient is pain-free and completely asymptomatic at this time, feel he is stable for discharge.  He is comfortable with this.  Given prescription for Zofran should nausea/vomiting recur.  Encouraged follow-up closely with primary care.  Return  here for any new or acute changes.  Final Clinical Impression(s) / ED Diagnoses Final diagnoses:  Generalized abdominal pain  Non-intractable vomiting with nausea, unspecified vomiting type    Rx / DC Orders ED Discharge Orders         Ordered    ondansetron (ZOFRAN ODT) 4 MG disintegrating tablet  Every 8 hours PRN        02/18/21 0607           Larene Pickett, PA-C 02/18/21 [id number]    Quintella Reichert, MD 02/19/21 0330

## 2021-02-18 NOTE — ED Notes (Signed)
Pt verbalized understanding of d/c, medication, and follow up care. Ambulatory with steady gait. Pt denies pain and nausea

## 2021-02-18 NOTE — Discharge Instructions (Signed)
I have written you prescription for zofran.  You do not have to fill this but keep on hand in case nausea/vomiting comes back. Follow-up with your primary care doctor. Return here for new concerns.

## 2021-02-19 ENCOUNTER — Other Ambulatory Visit: Payer: Self-pay | Admitting: Family Medicine

## 2021-04-07 ENCOUNTER — Other Ambulatory Visit: Payer: Self-pay

## 2021-04-07 ENCOUNTER — Encounter: Payer: Self-pay | Admitting: Family Medicine

## 2021-04-07 ENCOUNTER — Ambulatory Visit: Payer: 59 | Admitting: Family Medicine

## 2021-04-07 VITALS — BP 136/81 | HR 64 | Temp 97.7°F | Ht 75.0 in | Wt 203.0 lb

## 2021-04-07 DIAGNOSIS — Z7901 Long term (current) use of anticoagulants: Secondary | ICD-10-CM | POA: Diagnosis not present

## 2021-04-07 DIAGNOSIS — Z125 Encounter for screening for malignant neoplasm of prostate: Secondary | ICD-10-CM

## 2021-04-07 DIAGNOSIS — I482 Chronic atrial fibrillation, unspecified: Secondary | ICD-10-CM

## 2021-04-07 DIAGNOSIS — Z0000 Encounter for general adult medical examination without abnormal findings: Secondary | ICD-10-CM | POA: Diagnosis not present

## 2021-04-07 DIAGNOSIS — I10 Essential (primary) hypertension: Secondary | ICD-10-CM | POA: Diagnosis not present

## 2021-04-07 DIAGNOSIS — Z23 Encounter for immunization: Secondary | ICD-10-CM | POA: Diagnosis not present

## 2021-04-07 LAB — CBC WITH DIFFERENTIAL/PLATELET
Basophils Absolute: 0 10*3/uL (ref 0.0–0.1)
Basophils Relative: 0.5 % (ref 0.0–3.0)
Eosinophils Absolute: 0.2 10*3/uL (ref 0.0–0.7)
Eosinophils Relative: 2.9 % (ref 0.0–5.0)
HCT: 39 % (ref 39.0–52.0)
Hemoglobin: 13.2 g/dL (ref 13.0–17.0)
Lymphocytes Relative: 25.1 % (ref 12.0–46.0)
Lymphs Abs: 2 10*3/uL (ref 0.7–4.0)
MCHC: 33.9 g/dL (ref 30.0–36.0)
MCV: 94 fl (ref 78.0–100.0)
Monocytes Absolute: 0.8 10*3/uL (ref 0.1–1.0)
Monocytes Relative: 10.1 % (ref 3.0–12.0)
Neutro Abs: 4.9 10*3/uL (ref 1.4–7.7)
Neutrophils Relative %: 61.4 % (ref 43.0–77.0)
Platelets: 244 10*3/uL (ref 150.0–400.0)
RBC: 4.15 Mil/uL — ABNORMAL LOW (ref 4.22–5.81)
RDW: 12.8 % (ref 11.5–15.5)
WBC: 8 10*3/uL (ref 4.0–10.5)

## 2021-04-07 LAB — COMPREHENSIVE METABOLIC PANEL
ALT: 15 U/L (ref 0–53)
AST: 13 U/L (ref 0–37)
Albumin: 4.8 g/dL (ref 3.5–5.2)
Alkaline Phosphatase: 39 U/L (ref 39–117)
BUN: 16 mg/dL (ref 6–23)
CO2: 30 mEq/L (ref 19–32)
Calcium: 9.7 mg/dL (ref 8.4–10.5)
Chloride: 100 mEq/L (ref 96–112)
Creatinine, Ser: 0.88 mg/dL (ref 0.40–1.50)
GFR: 97.71 mL/min (ref >60.00)
Glucose, Bld: 78 mg/dL (ref 70–99)
Potassium: 4.5 mEq/L (ref 3.5–5.1)
Sodium: 138 mEq/L (ref 135–145)
Total Bilirubin: 0.4 mg/dL (ref 0.2–1.2)
Total Protein: 7.2 g/dL (ref 6.0–8.3)

## 2021-04-07 LAB — LIPID PANEL
Cholesterol: 193 mg/dL (ref 0–200)
HDL: 71.8 mg/dL (ref >39.00)
LDL Cholesterol: 98 mg/dL (ref 0–99)
NonHDL: 121.34
Total CHOL/HDL Ratio: 3
Triglycerides: 119 mg/dL (ref 0.0–149.0)
VLDL: 23.8 mg/dL (ref 0.0–40.0)

## 2021-04-07 LAB — PSA: PSA: 0.89 ng/mL (ref 0.10–4.00)

## 2021-04-07 LAB — MAGNESIUM: Magnesium: 2.1 mg/dL (ref 1.5–2.5)

## 2021-04-07 LAB — TSH: TSH: 1.03 u[IU]/mL (ref 0.35–4.50)

## 2021-04-07 MED ORDER — BISOPROLOL FUMARATE 10 MG PO TABS: 10.0000 mg | ORAL_TABLET | Freq: Every day | ORAL | 3 refills | Status: DC

## 2021-04-07 MED ORDER — LISINOPRIL 40 MG PO TABS: ORAL_TABLET | ORAL | 3 refills | Status: DC

## 2021-04-07 NOTE — Patient Instructions (Signed)
Health Maintenance, Male Adopting a healthy lifestyle and getting preventive care are important in promoting health and wellness. Ask your health care provider about: The right schedule for you to have regular tests and exams. Things you can do on your own to prevent diseases and keep yourself healthy. What should I know about diet, weight, and exercise? Eat a healthy diet  Eat a diet that includes plenty of vegetables, fruits, low-fat dairy products, and lean protein. Do not eat a lot of foods that are high in solid fats, added sugars, or sodium.  Maintain a healthy weight Body mass index (BMI) is a measurement that can be used to identify possible weight problems. It estimates body fat based on height and weight. Your health care provider can help determine your BMI and help you achieve or maintain ahealthy weight. Get regular exercise Get regular exercise. This is one of the most important things you can do for your health. Most adults should: Exercise for at least 150 minutes each week. The exercise should increase your heart rate and make you sweat (moderate-intensity exercise). Do strengthening exercises at least twice a week. This is in addition to the moderate-intensity exercise. Spend less time sitting. Even light physical activity can be beneficial. Watch cholesterol and blood lipids Have your blood tested for lipids and cholesterol at 54 years of age, then havethis test every 5 years. You may need to have your cholesterol levels checked more often if: Your lipid or cholesterol levels are high. You are older than 54 years of age. You are at high risk for heart disease. What should I know about cancer screening? Many types of cancers can be detected early and may often be prevented. Depending on your health history and family history, you may need to have cancer screening at various ages. This may include screening for: Colorectal cancer. Prostate cancer. Skin cancer. Lung  cancer. What should I know about heart disease, diabetes, and high blood pressure? Blood pressure and heart disease High blood pressure causes heart disease and increases the risk of stroke. This is more likely to develop in people who have high blood pressure readings, are of African descent, or are overweight. Talk with your health care provider about your target blood pressure readings. Have your blood pressure checked: Every 3-5 years if you are 18-39 years of age. Every year if you are 40 years old or older. If you are between the ages of 65 and 75 and are a current or former smoker, ask your health care provider if you should have a one-time screening for abdominal aortic aneurysm (AAA). Diabetes Have regular diabetes screenings. This checks your fasting blood sugar level. Have the screening done: Once every three years after age 45 if you are at a normal weight and have a low risk for diabetes. More often and at a younger age if you are overweight or have a high risk for diabetes. What should I know about preventing infection? Hepatitis B If you have a higher risk for hepatitis B, you should be screened for this virus. Talk with your health care provider to find out if you are at risk forhepatitis B infection. Hepatitis C Blood testing is recommended for: Everyone born from 1945 through 1965. Anyone with known risk factors for hepatitis C. Sexually transmitted infections (STIs) You should be screened each year for STIs, including gonorrhea and chlamydia, if: You are sexually active and are younger than 54 years of age. You are older than 54 years of age   and your health care provider tells you that you are at risk for this type of infection. Your sexual activity has changed since you were last screened, and you are at increased risk for chlamydia or gonorrhea. Ask your health care provider if you are at risk. Ask your health care provider about whether you are at high risk for HIV.  Your health care provider may recommend a prescription medicine to help prevent HIV infection. If you choose to take medicine to prevent HIV, you should first get tested for HIV. You should then be tested every 3 months for as long as you are taking the medicine. Follow these instructions at home: Lifestyle Do not use any products that contain nicotine or tobacco, such as cigarettes, e-cigarettes, and chewing tobacco. If you need help quitting, ask your health care provider. Do not use street drugs. Do not share needles. Ask your health care provider for help if you need support or information about quitting drugs. Alcohol use Do not drink alcohol if your health care provider tells you not to drink. If you drink alcohol: Limit how much you have to 0-2 drinks a day. Be aware of how much alcohol is in your drink. In the [place] one drink equals one 12 oz bottle of beer (355 mL), one 5 oz glass of wine (148 mL), or one 1 oz glass of hard liquor (44 mL). General instructions Schedule regular health, dental, and eye exams. Stay current with your vaccines. Tell your health care provider if: You often feel depressed. You have ever been abused or do not feel safe at home. Summary Adopting a healthy lifestyle and getting preventive care are important in promoting health and wellness. Follow your health care provider's instructions about healthy diet, exercising, and getting tested or screened for diseases. Follow your health care provider's instructions on monitoring your cholesterol and blood pressure. This information is not intended to replace advice given to you by your health care provider. Make sure you discuss any questions you have with your healthcare provider. Document Revised: 09/28/2018 Document Reviewed: 09/28/2018 Elsevier Patient Education  2022 [employer]  

## 2021-04-07 NOTE — Progress Notes (Signed)
Office Note 04/07/2021  CC:  Chief Complaint  Patient presents with   Follow-up   Hypertension    Pt is not fasting     HPI:  Robert Braun is a 55 y.o. White male who is here for annual health maintenance exam and 8 mo f/u HTN and chronic a-fib. A/P as of last visit: "1) HTN: cont lisinopril, 90d rx done today, but he wants to switch mgmt and ongoing RFs for this med to his cardiologist in [place].  This would be more convenient for him. Pt prefers no change in med today but was reminded of the importance of monitoring bp at home to have some more data to go on.   2) L knee pain: unknown etiology.  Normal knee exam today. No red flags to prompt any certain treatment or w/u.   However, he does want to see orthopedist for further eval. Referral ordered today.   3) OSA testing: suspect his cardiologist is wanting to screen for this to see if undiagnosed sleep apnea could be contributing to poor bp control + affecting his cardiomyopathy. He wants to hold off on referral for this right now but will call/send mychart message if he wants this before next f/u for his cpe in 6 mo.   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 He is asymptomatic. No change in meds for this today (BB, ACE-I).   5) Chronic a-fib: on amio, BB, and eliquis--as per cards."  INTERIM HX: Feeling well. Some inc stress with selling his house in [place], buying one here, awaiting visa renewal, says this has likely caused his bp elev here today. He has not been checking bp at home.    BP in the ED this spring 138/84 and 127/79 (food poisoning and "snake bite").  A-fib: only occ brief palpitations but doesn't feel heart racing.  No CP, SOB, nausea, or dizziness.  He f/u with EP MD at [facility] in about 2 wks. No signs of bleeding. No alcohol during the week, weekends drinks a fair amount of wine.  No hard  liquor or beer.   Past Medical History:  Diagnosis Date   Arthritis    foot   Atrial fibrillation ([place])    09/2018: Dr. Gwenlyn Found referred him to a-fib clinic for consideration or redo CV or ablation. After eval by a-fib clinic, he was referred to [facility] to adult congenital specialist and EP: amiodarone started, cMRI to be repeated.   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  Also Systemic RV EF 41%, Mild systemic AR and mild/mod systemic AV valve regurg.  Normal fxn of subpulmonic LV.   History of adenomatous polyp of colon 09/2019   recall 09/2024   History of kidney stones    passed stone - no surgery   History of pneumonia 10/2017   Hypertension    Migraines    occasional   Recurrent abdominal pain 2020   RLQ; blood and imaging eval unremarkable for cause, colonoscopy showed adenoma x 1, otherwise normal    Past Surgical History:  Procedure Laterality Date   Cardiac MRI  06/2019   EF 55%   CARDIAC SURGERY     correction of transposition of the great arteries noted on cardiac MRI done in the [place].,  pt denies heart surgery   CARDIOVERSION  03/2018   pt only briefly held sinus rhythm in [place]   CARDIOVERSION N/A 01/12/2019   Procedure: CARDIOVERSION;  Surgeon: Sanda Klein, MD;  Location: [facility];  Service: Cardiovascular;  Laterality: N/A;   COLONOSCOPY W/ POLYPECTOMY  09/2019   adenoma->recall 09/2024   TRANSTHORACIC ECHOCARDIOGRAM     in the [place]-->pt cannot recall any results.   TRANSTHORACIC ECHOCARDIOGRAM  09/2018   EF for both ventricles 25%, severe systemic AV (tricuspid) regurg--> pt needs gated CT angio [facility].   WISDOM TOOTH EXTRACTION      Family History  Problem Relation Age of Onset   Parkinson's disease Mother    Hypertension Father    Colon cancer Neg Hx     Esophageal cancer Neg Hx    Rectal cancer Neg Hx    Stomach cancer Neg Hx     Social History   Socioeconomic History   Marital status: Married    Spouse name: Not on file   Number of children: Not on file   Years of education: Not on file   Highest education level: Not on file  Occupational History   Not on file  Tobacco Use   Smoking status: Former    Years: 31.00    Pack years: 0.00    Types: Cigarettes    Quit date: 11/19/2013    Years since quitting: 7.3   Smokeless tobacco: Never  Vaping Use   Vaping Use: Every day  Substance and Sexual Activity   Alcohol use: Yes    Alcohol/week: 4.0 - 8.0 standard drinks    Types: 4 - 8 Glasses of wine per week    Comment: 1-2 bottles wine on Weekends (Fri-Sun)   Drug use: Never   Sexual activity: Not on file  Other Topics Concern   Not on file  Social History Narrative   Married, 1 daughter.   Orig from [place].   Lived in [place] 20 yrs, then moved to [facility] 04/2018.   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   Smoker until 2015-->switched to vap.   Alcohol: some beer and wine several days per week--sometimes >3 glasses of wine.   No drugs.   Social Determinants of Health   Financial Resource Strain: Not on file  Food Insecurity: Not on file  Transportation Needs: Not on file  Physical Activity: Not on file  Stress: Not on file  Social Connections: Not on file  Intimate Partner Violence: Not on file    Outpatient Medications Prior to Visit  Medication Sig Dispense Refill   amiodarone (PACERONE) 200 MG tablet Take 0.5 tablets by mouth daily.     Multiple Vitamin (MULTI-VITAMIN) tablet Take by mouth.     XARELTO 20 MG TABS tablet Take 20 mg by mouth daily.     bisoprolol (ZEBETA) 10 MG tablet TAKE 1 TABLET BY MOUTH EVERY DAY 14 tablet 0   ELIQUIS 5 MG TABS tablet Take 1 tablet (5 mg total) by mouth 2 (two) times daily. 180 tablet 0   lisinopril (ZESTRIL) 40 MG tablet TAKE 1 TABLET (40 MG TOTAL) BY MOUTH DAILY IN THE  AFTERNOON. 90 tablet 0   ondansetron (ZOFRAN ODT) 4 MG disintegrating tablet Take 1 tablet (4 mg total) by mouth every 8 (eight) hours as needed for nausea. 10 tablet 0   No facility-administered medications prior to visit.    No Known Allergies  ROS Review of  Systems  Constitutional:  Negative for appetite change, chills, fatigue and fever.  HENT:  Negative for congestion, dental problem, ear pain and sore throat.   Eyes:  Negative for discharge, redness and visual disturbance.  Respiratory:  Negative for cough, chest tightness, shortness of breath and wheezing.   Cardiovascular:  Negative for chest pain, palpitations and leg swelling.  Gastrointestinal:  Positive for abdominal pain (recurrent ,mild, see PMH section). Negative for blood in stool, diarrhea, nausea and vomiting.  Genitourinary:  Negative for difficulty urinating, dysuria, flank pain, frequency, hematuria and urgency.  Musculoskeletal:  Negative for arthralgias, back pain, joint swelling, myalgias and neck stiffness.  Skin:  Negative for pallor and rash.  Neurological:  Negative for dizziness, speech difficulty, weakness and headaches.  Hematological:  Negative for adenopathy. Does not bruise/bleed easily.  Psychiatric/Behavioral:  Negative for confusion and sleep disturbance. The patient is not nervous/anxious.    PE; Vitals with BMI 04/07/2021 04/07/2021 02/18/2021  Height - 6\' 3"  -  Weight - 203 lbs -  BMI - 27.03 -  Systolic 500 938 182  Diastolic 81 86 79  Pulse - 64 70   Gen: Alert, well appearing.  Patient is oriented to person, place, time, and situation. AFFECT: pleasant, lucid thought and speech. ENT: Ears: EACs clear, normal epithelium.  TMs with good light reflex and landmarks bilaterally.  Eyes: no injection, icteris, swelling, or exudate.  EOMI, PERRLA. Nose: no drainage or turbinate edema/swelling.  No injection or focal lesion.  Mouth: lips without lesion/swelling.  Oral mucosa pink and moist.  Dentition  intact and without obvious caries or gingival swelling.  Oropharynx without erythema, exudate, or swelling.  Neck: supple/nontender.  No LAD, mass, or TM.  Carotid pulses 2+ bilaterally, without bruits. CV: RRR, 9-9/3 systolic murmur at cardiac base.  No diastolic murmur.  No r/g.   LUNGS: CTA bilat, nonlabored resps, good aeration in all lung fields. ABD: soft, NT, ND, BS normal.  No hepatospenomegaly or mass.  No bruits. EXT: no clubbing, cyanosis, or edema.  Musculoskeletal: no joint swelling, erythema, warmth, or tenderness.  ROM of all joints intact. Skin - no sores or suspicious lesions or rashes or color changes  Pertinent labs:  Lab Results  Component Value Date   TSH 1.09 01/03/2020   Lab Results  Component Value Date   WBC 11.6 (H) 02/17/2021   HGB 12.9 (L) 02/17/2021   HCT 38.5 (L) 02/17/2021   MCV 94.8 02/17/2021   PLT 257 02/17/2021  No results found for: IRON, TIBC, FERRITIN  Lab Results  Component Value Date   CREATININE 0.91 02/17/2021   BUN 17 02/17/2021   NA 133 (L) 02/17/2021   K 3.8 02/17/2021   CL 99 02/17/2021   CO2 26 02/17/2021   Lab Results  Component Value Date   ALT 33 02/17/2021   AST 39 02/17/2021   ALKPHOS 33 (L) 02/17/2021   BILITOT 0.8 02/17/2021   Lab Results  Component Value Date   CHOL 189 08/03/2019   Lab Results  Component Value Date   HDL 70.80 08/03/2019   Lab Results  Component Value Date   LDLCALC 97 08/03/2019   Lab Results  Component Value Date   TRIG 104.0 08/03/2019   Lab Results  Component Value Date   CHOLHDL 3 08/03/2019   Lab Results  Component Value Date   PSA 0.65 08/03/2019   ASSESSMENT AND PLAN:   1) HTN: fair control. Needs to be more diligent about checking home bp some.  No med changes today (lisinopril 40 qd, bisoprolol 10 qd)--RFs. Lytes/cr today.  2) PAF: regular rhythm on exam today. Chronic anticoag for stroke prophylaxis->eliquis 5mg  bid.   Cont amiodarone and bisoprolol.  Asymptomatic  for the most part. Lytes (including magnesium) and sCr today. Forward lab results to EP MD at [facility]  3) Health maintenance exam: Reviewed age and gender appropriate health maintenance issues (prudent diet, regular exercise, health risks of tobacco and excessive alcohol, use of seatbelts, fire alarms in home, use of sunscreen).  Also reviewed age and gender appropriate health screening as well as vaccine recommendations. Vaccines: Prevnar 20 recommended->given today.  Shingrix discussed->defers for now. Labs: HP labs + PSA ordered (of note, he last ate 2 hours ago). Prostate ca screening: PSA ordered. Colon ca screening: hx of adenoma 09/2019, plan recall approx 09/2024.  An After Visit Summary was printed and given to the patient.  FOLLOW UP:  Return in about 6 months (around 10/07/2021) for routine chronic illness f/u.  Signed:  Crissie Sickles, MD           04/07/2021

## 2022-03-26 ENCOUNTER — Telehealth: Payer: Self-pay | Admitting: Family Medicine

## 2022-03-26 ENCOUNTER — Other Ambulatory Visit: Payer: Self-pay | Admitting: Family Medicine

## 2022-03-26 ENCOUNTER — Encounter: Payer: Self-pay | Admitting: Family Medicine

## 2022-03-26 NOTE — Telephone Encounter (Signed)
Pt will need an appointment with his PCP

## 2022-03-26 NOTE — Telephone Encounter (Signed)
30d supply sent. Pt to for f/u

## 2022-03-27 NOTE — Telephone Encounter (Signed)
Pt would like 15 more of these pills, he is going on an extended trip. I offered an appointment for today, he is on his way to [facility].   He did schedule an appointment for a CPE on 05/19/22. He needs 15 more pills of bisoprolol (ZEBETA) 10 MG tablet [[id number]] to cover this trip coming up.   Pharmacy [id number] [place], [place] - [address]  [address], [place] [address] Phone:  [phone] Fax:  [phone] DEA #:  [id number]

## 2022-03-27 NOTE — Telephone Encounter (Signed)
Pt was given 30 d/s on 03/26/22. No further refills at this time

## 2022-03-27 NOTE — Telephone Encounter (Signed)
Attempted to call pt. No VM

## 2022-04-02 ENCOUNTER — Other Ambulatory Visit: Payer: Self-pay

## 2022-04-02 MED ORDER — BISOPROLOL FUMARATE 10 MG PO TABS: 10.0000 mg | ORAL_TABLET | Freq: Every day | ORAL | 0 refills | Status: DC

## 2022-04-22 ENCOUNTER — Other Ambulatory Visit: Payer: Self-pay | Admitting: Family Medicine

## 2022-04-26 ENCOUNTER — Other Ambulatory Visit: Payer: Self-pay | Admitting: Family Medicine

## 2022-04-27 ENCOUNTER — Other Ambulatory Visit: Payer: Self-pay | Admitting: Family Medicine

## 2022-05-19 ENCOUNTER — Encounter: Payer: Self-pay | Admitting: Family Medicine

## 2022-05-19 ENCOUNTER — Ambulatory Visit (INDEPENDENT_AMBULATORY_CARE_PROVIDER_SITE_OTHER): Payer: 59 | Admitting: Family Medicine

## 2022-05-19 VITALS — BP 130/78 | HR 67 | Temp 97.3°F | Ht 76.75 in | Wt 193.6 lb

## 2022-05-19 DIAGNOSIS — I4819 Other persistent atrial fibrillation: Secondary | ICD-10-CM | POA: Diagnosis not present

## 2022-05-19 DIAGNOSIS — M25512 Pain in left shoulder: Secondary | ICD-10-CM

## 2022-05-19 DIAGNOSIS — Z0000 Encounter for general adult medical examination without abnormal findings: Secondary | ICD-10-CM

## 2022-05-19 DIAGNOSIS — D171 Benign lipomatous neoplasm of skin and subcutaneous tissue of trunk: Secondary | ICD-10-CM

## 2022-05-19 DIAGNOSIS — I10 Essential (primary) hypertension: Secondary | ICD-10-CM

## 2022-05-19 DIAGNOSIS — Z23 Encounter for immunization: Secondary | ICD-10-CM | POA: Diagnosis not present

## 2022-05-19 DIAGNOSIS — M898X1 Other specified disorders of bone, shoulder: Secondary | ICD-10-CM

## 2022-05-19 DIAGNOSIS — Z7901 Long term (current) use of anticoagulants: Secondary | ICD-10-CM

## 2022-05-19 DIAGNOSIS — M7918 Myalgia, other site: Secondary | ICD-10-CM

## 2022-05-19 DIAGNOSIS — E0580 Other thyrotoxicosis without thyrotoxic crisis or storm: Secondary | ICD-10-CM

## 2022-05-19 DIAGNOSIS — G8929 Other chronic pain: Secondary | ICD-10-CM

## 2022-05-19 DIAGNOSIS — L989 Disorder of the skin and subcutaneous tissue, unspecified: Secondary | ICD-10-CM

## 2022-05-19 DIAGNOSIS — Z125 Encounter for screening for malignant neoplasm of prostate: Secondary | ICD-10-CM | POA: Diagnosis not present

## 2022-05-19 HISTORY — DX: Other thyrotoxicosis without thyrotoxic crisis or storm: E05.80

## 2022-05-19 NOTE — Progress Notes (Unsigned)
Office Note 05/19/2022  CC:  Chief Complaint  Patient presents with   Annual Exam    Pt is not fasting   Patient is a 56 y.o. male who is here for annual health maintenance exam and follow-up hypertension. 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 He is on ACE-I for low EF. Taking amiodarone, bisoprolol, and eliquis for a-fib.  A/P as of last visit: "1) HTN: fair control. Needs to be more diligent about checking home bp some.  No med changes today (lisinopril 40 qd, bisoprolol 10 qd)--RFs. Lytes/cr today.   2) PAF: regular rhythm on exam today. Chronic anticoag for stroke prophylaxis->eliquis '5mg'$  bid.   Cont amiodarone and bisoprolol.  Asymptomatic for the most part. Lytes (including magnesium) and sCr today. Forward lab results to EP MD at [facility].   3) Health maintenance exam: Reviewed age and gender appropriate health maintenance issues (prudent diet, regular exercise, health risks of tobacco and excessive alcohol, use of seatbelts, fire alarms in home, use of sunscreen).  Also reviewed age and gender appropriate health screening as well as vaccine recommendations. Vaccines: Prevnar 20 recommended->given today.  Shingrix discussed->defers for now. Labs: HP labs + PSA ordered (of note, he last ate 2 hours ago). Prostate ca screening: PSA ordered. Colon ca screening: hx of adenoma 09/2019, plan recall approx 09/2024."  INTERIM HX: Robert Braun feels pretty well overall, just a couple of concerns: #1 when he was in [place] recently he started to have an episode of palpitations, went to the emergency department, he was monitored on telemetry for 6 hours and had sinus rhythm in the 60-70 per range during the whole time.  Some PVCs noted, rare couplets no NSVT, no A-fib paroxysms.  Some PACs noted as well. Informal echo in the emergency department shows mild MR, left ventricular end-diastolic pressure of 51,  ejection fraction approximately 40%.  No pericardial effusion. Due to his PVCs he was instructed to add a 2.5 mg dose of bisoprolol in the evening.  He currently also takes his 10 mg dose in the morning. No episodes since that time.  He does have plans to follow-up with his cardiologist here in [facility] and will likely get a more formal echo at that time.  #2 left shoulder pain and crackling.  No preceding injury.  He does have poor posture and extra tension in the neck and back and shoulders lately.  Range of motion of the left shoulder elicits a crackling sound that is hard to localize.  He points to the area of the trapezius and rhomboid region of the upper back as his location of pain, not particularly over the shoulder.  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  Also has a skin lesion in the right groin area that he would like to get removed.  He requests referral for this today. He has cut back on drinking, hardly drinks alcohol at all now.    Past Medical History:  Diagnosis Date   Arthritis    foot   Atrial fibrillation ([place])    09/2018: Dr. Gwenlyn Found referred him to a-fib clinic for consideration or redo CV or ablation. After eval by a-fib clinic, he was referred to [facility] to adult congenital specialist and EP: amiodarone started, cMRI to be repeated.   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  Also Systemic RV EF 41%, Mild systemic AR and mild/mod systemic AV valve regurg.  Normal fxn of subpulmonic LV.   History of adenomatous polyp of colon 09/2019   recall 09/2024   History of kidney stones    passed stone - no surgery   History of pneumonia 10/2017   Hypertension    Migraines    occasional   Recurrent abdominal pain 2020   RLQ; blood and imaging eval unremarkable  for cause, colonoscopy showed adenoma x 1, otherwise normal    Past Surgical History:  Procedure Laterality Date   Cardiac MRI  06/2019   EF 55%   CARDIAC SURGERY     correction of transposition of the great arteries noted on cardiac MRI Braun in the [place]., pt denies heart surgery   CARDIOVERSION  03/2018   pt only briefly held sinus rhythm in [place]   CARDIOVERSION N/A 01/12/2019   Procedure: CARDIOVERSION;  Surgeon: Sanda Klein, MD;  Location: [facility];  Service: Cardiovascular;  Laterality: N/A;   COLONOSCOPY W/ POLYPECTOMY  09/2019   adenoma->recall 09/2024   TRANSTHORACIC ECHOCARDIOGRAM     in the [place]-->pt cannot recall any results.   TRANSTHORACIC ECHOCARDIOGRAM  09/2018   EF for both ventricles 25%, severe systemic AV (tricuspid) regurg--> pt ref'd to [facility] adult congential heart dz clinic   WISDOM TOOTH EXTRACTION      Family History  Problem Relation Age of Onset   Parkinson's disease Mother    Hypertension Father    Colon cancer Neg Hx    Esophageal cancer Neg Hx    Rectal cancer Neg Hx    Stomach cancer Neg Hx     Social History   Socioeconomic History   Marital status: Married    Spouse name: Not on file   Number of children: Not on file   Years of education: Not on file   Highest education level: Not on file  Occupational History   Not on file  Tobacco Use   Smoking status: Former    Years: 31.00    Types: Cigarettes    Quit date: 11/19/2013    Years since quitting: 8.5   Smokeless tobacco: Never  Vaping Use   Vaping Use: Every day  Substance and Sexual Activity   Alcohol use: Yes    Alcohol/week: 4.0 - 8.0 standard drinks of alcohol    Types: 4 - 8 Glasses of wine per week    Comment: 1-2 bottles wine on Weekends (Fri-Sun)   Drug use: Never   Sexual activity: Not on file  Other Topics Concern   Not on file  Social History Narrative   Married, 1 daughter.   Orig from [place].   Lived in [place] 20 yrs, then moved to [facility] 04/2018.   Occup:  Homemaker.  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   Smoker until 2015-->switched to vap.   Alcohol: some beer and wine several days per week--sometimes >3 glasses of wine.   No drugs.   Social Determinants of Health   Financial Resource Strain: Not on file  Food Insecurity: Not on file  Transportation Needs: Not on file  Physical Activity: Not on file  Stress: Not on file  Social Connections: Not on file  Intimate Partner Violence: Not on file    Outpatient Medications Prior to Visit  Medication Sig Dispense Refill   amiodarone (PACERONE) 200 MG tablet  Take 0.5 tablets by mouth daily.     bisoprolol (ZEBETA) 10 MG tablet TAKE 1 TABLET BY MOUTH EVERY DAY 90 tablet 0   lisinopril (ZESTRIL) 40 MG tablet TAKE 1 TABLET (40 MG TOTAL) BY MOUTH DAILY IN THE AFTERNOON. 90 tablet 0   Multiple Vitamin (MULTI-VITAMIN) tablet Take by mouth.     spironolactone (ALDACTONE) 25 MG tablet Take 25 mg by mouth daily.     XARELTO 20 MG TABS tablet Take 20 mg by mouth daily.     No facility-administered medications prior to visit.    No Known Allergies  ROS Review of Systems  Constitutional:  Negative for appetite change, chills, fatigue and fever.  HENT:  Negative for congestion, dental problem, ear pain and sore throat.   Eyes:  Negative for discharge, redness and visual disturbance.  Respiratory:  Negative for cough, chest tightness, shortness of breath and wheezing.   Cardiovascular:  Negative for chest pain, palpitations and leg swelling.  Gastrointestinal:  Negative for abdominal pain, blood in stool, diarrhea, nausea and vomiting.  Genitourinary:  Negative for difficulty urinating, dysuria, flank pain, frequency, hematuria and urgency.  Musculoskeletal:  Positive for arthralgias (L shoulder and upper back). Negative for back pain, joint swelling, myalgias and neck stiffness.  Skin:  Negative for pallor and rash.  Neurological:  Negative for dizziness, speech difficulty, weakness and headaches.   Hematological:  Negative for adenopathy. Does not bruise/bleed easily.  Psychiatric/Behavioral:  Negative for confusion and sleep disturbance. The patient is not nervous/anxious.     PE;    05/19/2022   10:39 AM 05/19/2022   10:23 AM 04/07/2021    1:31 PM  Vitals with BMI  Height  6' 4.75"   Weight  193 lbs 10 oz   BMI  40.98   Systolic 119 147 829  Diastolic 78 80 81  Pulse  67     Gen: Alert, well appearing.  Patient is oriented to person, place, time, and situation. AFFECT: pleasant, lucid thought and speech. ENT: Ears: EACs clear, normal epithelium.  TMs with good light reflex and landmarks bilaterally.  Eyes: no injection, icteris, swelling, or exudate.  EOMI, PERRLA. Nose: no drainage or turbinate edema/swelling.  No injection or focal lesion.  Mouth: lips without lesion/swelling.  Oral mucosa pink and moist.  Dentition intact and without obvious caries or gingival swelling.  Oropharynx without erythema, exudate, or swelling.  Neck: supple/nontender.  No LAD, mass, or TM.  Carotid pulses 2+ bilaterally, without bruits. CV: RRR with occasional isolated ectopic beat.  Soft systolic murmur heard best at left lower sternal border.  No r/g.   LUNGS: CTA bilat, nonlabored resps, good aeration in all lung fields. ABD: soft, NT, ND, BS normal.  No hepatospenomegaly or mass.  No bruits. EXT: no clubbing, cyanosis, or edema.  Musculoskeletal: no joint swelling, erythema, warmth, or tenderness.  ROM of all joints intact. Mild tenderness to palpation over the rhomboids region on the left upper back. No tenderness over the shoulders.  Shoulder provocative maneuvers do not elicit any pain in the shoulder.  Moving the shoulder and its full range of motion does elicit crackling that is hard to localize to any particular region of the shoulder girdle. Skin -he has a approximately fist-sized soft subcutaneous swelling over the top of his left shoulder to the left of midline.  Nontender, no erythema.    Over the right inguinal ligament region he has a 2 cm brown verrucous-appearing lesion. Otherwise, no sores or suspicious lesions  or rashes or color changes  Pertinent labs:  Lab Results  Component Value Date   TSH 1.03 04/07/2021   Lab Results  Component Value Date   WBC 8.0 04/07/2021   HGB 13.2 04/07/2021   HCT 39.0 04/07/2021   MCV 94.0 04/07/2021   PLT 244.0 04/07/2021   Lab Results  Component Value Date   CREATININE 0.88 04/07/2021   BUN 16 04/07/2021   NA 138 04/07/2021   K 4.5 04/07/2021   CL 100 04/07/2021   CO2 30 04/07/2021   Lab Results  Component Value Date   ALT 15 04/07/2021   AST 13 04/07/2021   ALKPHOS 39 04/07/2021   BILITOT 0.4 04/07/2021   Lab Results  Component Value Date   CHOL 193 04/07/2021   Lab Results  Component Value Date   HDL 71.80 04/07/2021   Lab Results  Component Value Date   LDLCALC 98 04/07/2021   Lab Results  Component Value Date   TRIG 119.0 04/07/2021   Lab Results  Component Value Date   CHOLHDL 3 04/07/2021   Lab Results  Component Value Date   PSA 0.89 04/07/2021   PSA 0.65 08/03/2019   ASSESSMENT AND PLAN:   1) Health maintenance exam: Reviewed age and gender appropriate health maintenance issues (prudent diet, regular exercise, health risks of tobacco and excessive alcohol, use of seatbelts, fire alarms in home, use of sunscreen).  Also reviewed age and gender appropriate health screening as well as vaccine recommendations. Vaccines: Shingrix->#1 today. otherwise up-to-date. Labs: cbc,cmet, tsh, lipids, psa --->return when fasting. Prostate ca screening: PSA ordered Colon ca screening:  recall 09/2024  #2 palpitations-->hx of PAF: regular rhythm on exam today. Chronic anticoag for stroke prophylaxis->eliquis '5mg'$  bid.   Cont amiodarone 100 mg a day and bisoprolol 10 mg every morning and 2.5 mg every afternoon (see HPI). Electrolytes and renal function today.  #3 nonischemic cardiomyopathy. Followed by  [facility]. Continue bisoprolol, lisinopril, and spironolactone 25 mg a day. He will be following up with them soon and getting a repeat echocardiogram.  #4 left shoulder pain/scapular pain--> muscular.  This is largely in the back of the shoulder and the area medial to the left scapula.  Plan is to check a left shoulder x-ray and refer to physical therapy.  #5 upper back lipoma and right groin skin lesion. Refer to dermatology for further evaluation--- because patient requests excision of both.  #6 hypertension.  Well-controlled on bisoprolol 10 mg every morning and 2.5 mg every afternoon.  Also lisinopril 40 mg daily and spironolactone 25 mg daily. Electrolytes and creatinine today.  An After Visit Summary was printed and given to the patient.  FOLLOW UP:  No follow-ups on file.  Signed:  Crissie Sickles, MD           05/19/2022

## 2022-05-21 ENCOUNTER — Other Ambulatory Visit (INDEPENDENT_AMBULATORY_CARE_PROVIDER_SITE_OTHER): Payer: 59

## 2022-05-21 DIAGNOSIS — Z125 Encounter for screening for malignant neoplasm of prostate: Secondary | ICD-10-CM | POA: Diagnosis not present

## 2022-05-21 DIAGNOSIS — I4819 Other persistent atrial fibrillation: Secondary | ICD-10-CM | POA: Diagnosis not present

## 2022-05-21 DIAGNOSIS — Z7901 Long term (current) use of anticoagulants: Secondary | ICD-10-CM | POA: Diagnosis not present

## 2022-05-21 DIAGNOSIS — I10 Essential (primary) hypertension: Secondary | ICD-10-CM

## 2022-05-21 LAB — PSA: PSA: 0.51 ng/mL (ref 0.10–4.00)

## 2022-05-21 LAB — COMPREHENSIVE METABOLIC PANEL
ALT: 17 U/L (ref 0–53)
AST: 14 U/L (ref 0–37)
Albumin: 4.2 g/dL (ref 3.5–5.2)
Alkaline Phosphatase: 34 U/L — ABNORMAL LOW (ref 39–117)
BUN: 8 mg/dL (ref 6–23)
CO2: 30 mEq/L (ref 19–32)
Calcium: 9.4 mg/dL (ref 8.4–10.5)
Chloride: 98 mEq/L (ref 96–112)
Creatinine, Ser: 0.77 mg/dL (ref 0.40–1.50)
GFR: 100.93 mL/min (ref >60.00)
Glucose, Bld: 109 mg/dL — ABNORMAL HIGH (ref 70–99)
Potassium: 5.2 mEq/L — ABNORMAL HIGH (ref 3.5–5.1)
Sodium: 134 mEq/L — ABNORMAL LOW (ref 135–145)
Total Bilirubin: 0.4 mg/dL (ref 0.2–1.2)
Total Protein: 6.7 g/dL (ref 6.0–8.3)

## 2022-05-21 LAB — LIPID PANEL
Cholesterol: 136 mg/dL (ref 0–200)
HDL: 49.6 mg/dL (ref >39.00)
LDL Cholesterol: 66 mg/dL (ref 0–99)
NonHDL: 86.57
Total CHOL/HDL Ratio: 3
Triglycerides: 101 mg/dL (ref 0.0–149.0)
VLDL: 20.2 mg/dL (ref 0.0–40.0)

## 2022-05-21 LAB — CBC WITH DIFFERENTIAL/PLATELET
Basophils Absolute: 0.1 10*3/uL (ref 0.0–0.1)
Basophils Relative: 0.8 % (ref 0.0–3.0)
Eosinophils Absolute: 0.2 10*3/uL (ref 0.0–0.7)
Eosinophils Relative: 2.5 % (ref 0.0–5.0)
HCT: 37.5 % — ABNORMAL LOW (ref 39.0–52.0)
Hemoglobin: 12.7 g/dL — ABNORMAL LOW (ref 13.0–17.0)
Lymphocytes Relative: 25.5 % (ref 12.0–46.0)
Lymphs Abs: 1.6 10*3/uL (ref 0.7–4.0)
MCHC: 34 g/dL (ref 30.0–36.0)
MCV: 93.4 fl (ref 78.0–100.0)
Monocytes Absolute: 1.2 10*3/uL — ABNORMAL HIGH (ref 0.1–1.0)
Monocytes Relative: 18.6 % — ABNORMAL HIGH (ref 3.0–12.0)
Neutro Abs: 3.4 10*3/uL (ref 1.4–7.7)
Neutrophils Relative %: 52.6 % (ref 43.0–77.0)
Platelets: 232 10*3/uL (ref 150.0–400.0)
RBC: 4.01 Mil/uL — ABNORMAL LOW (ref 4.22–5.81)
RDW: 12.5 % (ref 11.5–15.5)
WBC: 6.4 10*3/uL (ref 4.0–10.5)

## 2022-05-21 LAB — TSH: TSH: <0.01 u[IU]/mL — ABNORMAL LOW (ref 0.35–5.50)

## 2022-05-22 ENCOUNTER — Telehealth: Payer: Self-pay | Admitting: Family Medicine

## 2022-05-22 ENCOUNTER — Other Ambulatory Visit (INDEPENDENT_AMBULATORY_CARE_PROVIDER_SITE_OTHER): Payer: 59

## 2022-05-22 ENCOUNTER — Other Ambulatory Visit: Payer: Self-pay | Admitting: Family Medicine

## 2022-05-22 DIAGNOSIS — E0590 Thyrotoxicosis, unspecified without thyrotoxic crisis or storm: Secondary | ICD-10-CM

## 2022-05-22 DIAGNOSIS — E875 Hyperkalemia: Secondary | ICD-10-CM

## 2022-05-22 DIAGNOSIS — R7989 Other specified abnormal findings of blood chemistry: Secondary | ICD-10-CM

## 2022-05-22 LAB — T3, FREE: T3, Free: 6.8 pg/mL — ABNORMAL HIGH (ref 2.3–4.2)

## 2022-05-22 LAB — T4, FREE: Free T4: 3.46 ng/dL — ABNORMAL HIGH (ref 0.60–1.60)

## 2022-05-22 NOTE — Telephone Encounter (Signed)
1.  Significant hyperthyroidism on labs today. Has been on amiodarone for 3 years without any recent dose change. Most recent thyroid profile August 2022 was normal. Although his amiodarone use does complicate the picture, I do think he has Graves' disease. Plan is to have him come early next week to repeat thyroid panel and do thyrotropin receptor antibody. I have ordered an urgent endocrinology referral--urgency based primarily on the fact that he occasionally has palpitations and has history of atrial fibrillation and cardiomyopathy.  Would like specialist guidance on further work-up and whether or not to start methimazole now or wait. If thyrotropin receptor antibody level negative then I will order radioactive iodine uptake scan to be done unless endocrinologist can see him first.  2. borderline hyperkalemia on today's labs (5.2). Patient was started on spironolactone by [facility] cardiologist in January of this year.  This is the first potassium level done since then.   I advised the patient to stop spironolactone.  All the above was discussed with patient by phone this evening.  Signed:  Crissie Sickles, MD           05/22/2022

## 2022-05-25 ENCOUNTER — Inpatient Hospital Stay (HOSPITAL_COMMUNITY)
Admission: EM | Admit: 2022-05-25 | Discharge: 2022-05-30 | DRG: 310 | Disposition: A | Discharge status: Home / Self Care | Payer: 59 | Attending: Internal Medicine | Admitting: Internal Medicine

## 2022-05-25 ENCOUNTER — Emergency Department (HOSPITAL_COMMUNITY): Payer: 59

## 2022-05-25 DIAGNOSIS — Z7901 Long term (current) use of anticoagulants: Secondary | ICD-10-CM

## 2022-05-25 DIAGNOSIS — I083 Combined rheumatic disorders of mitral, aortic and tricuspid valves: Secondary | ICD-10-CM | POA: Diagnosis present

## 2022-05-25 DIAGNOSIS — I482 Chronic atrial fibrillation, unspecified: Secondary | ICD-10-CM | POA: Diagnosis present

## 2022-05-25 DIAGNOSIS — Y92009 Unspecified place in unspecified non-institutional (private) residence as the place of occurrence of the external cause: Secondary | ICD-10-CM

## 2022-05-25 DIAGNOSIS — I480 Paroxysmal atrial fibrillation: Principal | ICD-10-CM | POA: Diagnosis present

## 2022-05-25 DIAGNOSIS — K7689 Other specified diseases of liver: Secondary | ICD-10-CM | POA: Diagnosis present

## 2022-05-25 DIAGNOSIS — I4891 Unspecified atrial fibrillation: Principal | ICD-10-CM

## 2022-05-25 DIAGNOSIS — Z5329 Procedure and treatment not carried out because of patient's decision for other reasons: Secondary | ICD-10-CM | POA: Diagnosis present

## 2022-05-25 DIAGNOSIS — E0590 Thyrotoxicosis, unspecified without thyrotoxic crisis or storm: Secondary | ICD-10-CM | POA: Diagnosis present

## 2022-05-25 DIAGNOSIS — Z820 Family history of epilepsy and other diseases of the nervous system: Secondary | ICD-10-CM

## 2022-05-25 DIAGNOSIS — Z86010 Personal history of colonic polyps: Secondary | ICD-10-CM

## 2022-05-25 DIAGNOSIS — E039 Hypothyroidism, unspecified: Secondary | ICD-10-CM | POA: Diagnosis present

## 2022-05-25 DIAGNOSIS — Z87891 Personal history of nicotine dependence: Secondary | ICD-10-CM

## 2022-05-25 DIAGNOSIS — D649 Anemia, unspecified: Secondary | ICD-10-CM | POA: Diagnosis present

## 2022-05-25 DIAGNOSIS — E0580 Other thyrotoxicosis without thyrotoxic crisis or storm: Secondary | ICD-10-CM | POA: Diagnosis present

## 2022-05-25 DIAGNOSIS — Z87442 Personal history of urinary calculi: Secondary | ICD-10-CM

## 2022-05-25 DIAGNOSIS — Q249 Congenital malformation of heart, unspecified: Secondary | ICD-10-CM

## 2022-05-25 DIAGNOSIS — T462X5A Adverse effect of other antidysrhythmic drugs, initial encounter: Secondary | ICD-10-CM | POA: Diagnosis present

## 2022-05-25 DIAGNOSIS — Z8774 Personal history of (corrected) congenital malformations of heart and circulatory system: Secondary | ICD-10-CM

## 2022-05-25 DIAGNOSIS — Z79899 Other long term (current) drug therapy: Secondary | ICD-10-CM

## 2022-05-25 DIAGNOSIS — R000 Tachycardia, unspecified: Secondary | ICD-10-CM | POA: Diagnosis present

## 2022-05-25 DIAGNOSIS — Z8249 Family history of ischemic heart disease and other diseases of the circulatory system: Secondary | ICD-10-CM

## 2022-05-25 DIAGNOSIS — I10 Essential (primary) hypertension: Secondary | ICD-10-CM | POA: Diagnosis present

## 2022-05-25 DIAGNOSIS — E875 Hyperkalemia: Secondary | ICD-10-CM | POA: Diagnosis present

## 2022-05-25 LAB — TROPONIN I (HIGH SENSITIVITY)
Troponin I (High Sensitivity): 14 ng/L (ref <18)
Troponin I (High Sensitivity): 27 ng/L — ABNORMAL HIGH (ref <18)

## 2022-05-25 LAB — COMPREHENSIVE METABOLIC PANEL
ALT: 22 U/L (ref 0–44)
AST: 18 U/L (ref 15–41)
Albumin: 4.1 g/dL (ref 3.5–5.0)
Alkaline Phosphatase: 37 U/L — ABNORMAL LOW (ref 38–126)
Anion gap: 8 (ref 5–15)
BUN: 14 mg/dL (ref 6–20)
CO2: 25 mmol/L (ref 22–32)
Calcium: 9.3 mg/dL (ref 8.9–10.3)
Chloride: 98 mmol/L (ref 98–111)
Creatinine, Ser: 0.78 mg/dL (ref 0.61–1.24)
GFR, Estimated: >60 mL/min (ref >60)
Glucose, Bld: 116 mg/dL — ABNORMAL HIGH (ref 70–99)
Potassium: 4.1 mmol/L (ref 3.5–5.1)
Sodium: 131 mmol/L — ABNORMAL LOW (ref 135–145)
Total Bilirubin: 0.6 mg/dL (ref 0.3–1.2)
Total Protein: 7.1 g/dL (ref 6.5–8.1)

## 2022-05-25 LAB — CBC
HCT: 36.4 % — ABNORMAL LOW (ref 39.0–52.0)
Hemoglobin: 12.5 g/dL — ABNORMAL LOW (ref 13.0–17.0)
MCH: 31.1 pg (ref 26.0–34.0)
MCHC: 34.3 g/dL (ref 30.0–36.0)
MCV: 90.5 fL (ref 80.0–100.0)
Platelets: 275 10*3/uL (ref 150–400)
RBC: 4.02 MIL/uL — ABNORMAL LOW (ref 4.22–5.81)
RDW: 11.3 % — ABNORMAL LOW (ref 11.5–15.5)
WBC: 8.2 10*3/uL (ref 4.0–10.5)
nRBC: 0 % (ref 0.0–0.2)

## 2022-05-25 MED ORDER — DILTIAZEM HCL-DEXTROSE 125-5 MG/125ML-% IV SOLN (PREMIX)
5.0000 mg/h | INTRAVENOUS | Status: DC
  Administered 2022-05-25: 5 mg/h via INTRAVENOUS
  Filled 2022-05-25 (×3): qty 125

## 2022-05-25 MED ORDER — LORAZEPAM 1 MG PO TABS
1.0000 mg | ORAL_TABLET | Freq: Once | ORAL | Status: AC
  Administered 2022-05-25: 1 mg via ORAL
  Filled 2022-05-25: qty 1

## 2022-05-25 MED ORDER — DILTIAZEM HCL 25 MG/5ML IV SOLN
20.0000 mg | Freq: Once | INTRAVENOUS | Status: AC
Start: 2022-05-25 — End: 2022-05-25
  Administered 2022-05-25: 20 mg via INTRAVENOUS
  Filled 2022-05-25: qty 5

## 2022-05-25 MED ORDER — DILTIAZEM LOAD VIA INFUSION
20.0000 mg | Freq: Once | INTRAVENOUS | Status: DC
  Filled 2022-05-25: qty 20

## 2022-05-25 NOTE — ED Triage Notes (Signed)
Patient with history of atrial fibrillation and cardioversions here with complaint of feeling like he is in afib again. Patient is alert, oriented, ambulatory, speaking in complete sentences, and is in no apparent distress at this time.

## 2022-05-25 NOTE — ED Provider Notes (Signed)
[facility] EMERGENCY DEPARTMENT Provider Note   CSN: [id number] Arrival date & time: 05/25/22  1756     History  Chief Complaint  Patient presents with   Atrial Fibrillation    Robert Braun is a 56 y.o. male.  Presented to the emergency department due to concern for atrial fibrillation.  Patient has a long history of prior episodes of A-fib.  On anticoagulation.  No missed doses of his Xarelto recently.  This afternoon he felt sudden onset of palpitations.  Felt similar to prior episodes.  PCP had recent outpatient labs that showed elevated thyroid levels.  HPI     Home Medications Prior to Admission medications   Medication Sig Start Date End Date Taking? Authorizing Provider  amiodarone (PACERONE) 200 MG tablet Take 0.5 tablets by mouth daily. 09/15/19  Yes [provider]  bisoprolol (ZEBETA) 10 MG tablet TAKE 1 TABLET BY MOUTH EVERY DAY Patient taking differently: Take 10 mg by mouth daily. 04/28/22  Yes McGowen, Adrian Blackwater, MD  lisinopril (ZESTRIL) 40 MG tablet TAKE 1 TABLET (40 MG TOTAL) BY MOUTH DAILY IN THE AFTERNOON. Patient taking differently: Take 40 mg by mouth daily. TAKE 1 TABLET (40 MG TOTAL) BY MOUTH DAILY IN THE AFTERNOON. 04/28/22  Yes McGowen, Adrian Blackwater, MD  Multiple Vitamin (MULTI-VITAMIN) tablet Take 1 tablet by mouth daily.   Yes [provider]  XARELTO 20 MG TABS tablet Take 20 mg by mouth daily. 02/03/21  Yes [provider]      Allergies    Patient has no known allergies.    Review of Systems   Review of Systems  Constitutional:  Negative for chills and fever.  HENT:  Negative for ear pain and sore throat.   Eyes:  Negative for pain and visual disturbance.  Respiratory:  Negative for cough and shortness of breath.   Cardiovascular:  Positive for palpitations. Negative for chest pain.  Gastrointestinal:  Negative for abdominal pain and vomiting.  Genitourinary:  Negative for dysuria and hematuria.   Musculoskeletal:  Negative for arthralgias and back pain.  Skin:  Negative for color change and rash.  Neurological:  Negative for seizures and syncope.  All other systems reviewed and are negative.   Physical Exam Updated Vital Signs BP (!) 141/86   Pulse 90   Temp 98.5 F (36.9 C) (Oral)   Resp 15   SpO2 97%  Physical Exam Vitals and nursing note reviewed.  Constitutional:      General: He is not in acute distress.    Appearance: He is well-developed.  HENT:     Head: Normocephalic and atraumatic.  Eyes:     Conjunctiva/sclera: Conjunctivae normal.  Cardiovascular:     Rate and Rhythm: Tachycardia present. Rhythm irregular.     Heart sounds: No murmur heard. Pulmonary:     Effort: Pulmonary effort is normal. No respiratory distress.     Breath sounds: Normal breath sounds.  Abdominal:     Palpations: Abdomen is soft.     Tenderness: There is no abdominal tenderness.  Musculoskeletal:        General: No swelling.     Cervical back: Neck supple.  Skin:    General: Skin is warm and dry.     Capillary Refill: Capillary refill takes less than 2 seconds.  Neurological:     Mental Status: He is alert.  Psychiatric:        Mood and Affect: Mood normal.     ED Results /  Procedures / Treatments   Labs (all labs ordered are listed, but only abnormal results are displayed) Labs Reviewed  CBC - Abnormal; Notable for the following components:      Result Value   RBC 4.02 (*)    Hemoglobin 12.5 (*)    HCT 36.4 (*)    RDW 11.3 (*)    All other components within normal limits  COMPREHENSIVE METABOLIC PANEL - Abnormal; Notable for the following components:   Sodium 131 (*)    Glucose, Bld 116 (*)    Alkaline Phosphatase 37 (*)    All other components within normal limits  TROPONIN I (HIGH SENSITIVITY) - Abnormal; Notable for the following components:   Troponin I (High Sensitivity) 27 (*)    All other components within normal limits  THYROTROPIN RECEPTOR AUTOABS   TROPONIN I (HIGH SENSITIVITY)    EKG None  Radiology DG Chest 1 View  Result Date: 05/25/2022 CLINICAL DATA:  Palpitations. EXAM: CHEST  1 VIEW COMPARISON:  None Available. FINDINGS: The heart size and mediastinal contours are within normal limits. Both lungs are clear. The visualized skeletal structures are unremarkable. IMPRESSION: No active disease. Electronically Signed   By: Virgina Norfolk M.D.   On: 05/25/2022 18:55    Procedures .Critical Care  Performed by: Lucrezia Starch, MD Authorized by: Lucrezia Starch, MD   Critical care provider statement:    Critical care time (minutes):  38   Critical care was necessary to treat or prevent imminent or life-threatening deterioration of the following conditions:  Cardiac failure   Critical care was time spent personally by me on the following activities:  Development of treatment plan with patient or surrogate, discussions with consultants, evaluation of patient's response to treatment, examination of patient, ordering and review of laboratory studies, ordering and review of radiographic studies, ordering and performing treatments and interventions, pulse oximetry, re-evaluation of patient's condition and review of old charts     Medications Ordered in ED Medications  diltiazem (CARDIZEM) 125 mg in dextrose 5% 125 mL (1 mg/mL) infusion (10 mg/hr Intravenous Rate/Dose Change 05/25/22 2208)  diltiazem (CARDIZEM) injection 20 mg (20 mg Intravenous Given 05/25/22 2148)    ED Course/ Medical Decision Making/ A&P                           Medical Decision Making Risk Prescription drug management. Decision regarding hospitalization.   56 year old gentleman presenting to the emergency room due to concern for A-fib.  Has prior history of A-fib is not on anticoagulation, normally in sinus rhythm.  Currently heart rate ranging from 130s to 140s.  BP stable, patient well-appearing.  Given he is already on anticoagulation, feel he would  be a good ER cardioversion candidate.  Lengthy discussion with patient regarding treatment options and he declined cardioversion.  He was amenable to rate control with diltiazem gtt. His basic labs are stable today.  Have discussed with Dr. Hal Hope who will admit.          Final Clinical Impression(s) / ED Diagnoses Final diagnoses:  Atrial fibrillation with [facility])    Rx / DC Orders ED Discharge Orders     None         Lucrezia Starch, MD 05/25/22 2314

## 2022-05-25 NOTE — ED Notes (Signed)
Waiting for medication from pharmacy.

## 2022-05-25 NOTE — ED Notes (Addendum)
Patient remains in afib with a decrease heart rate of 86-112, after bolus. See EKG at 2152

## 2022-05-25 NOTE — ED Notes (Addendum)
Patient was able to ambulate to the bathroom. He is now back in his room. Patient was given some thing to eat and drink. Noticed the patient was scratching his chest and his flank. He said it helps to distract him from his abnormal heart rate. He also said he is worried about his daughter. MD was made aware and medication was given for anxiety. Patient remain in Afib, HR 97-104.

## 2022-05-25 NOTE — Telephone Encounter (Signed)
Per provider, patient will be calling Monday, 05/25/2022 to arrange lab appointment (non-fasting).  Needs this done ASAP.  I have ordered labs.

## 2022-05-25 NOTE — ED Provider Triage Note (Signed)
Emergency Medicine Provider Triage Evaluation Note  Robert Braun , a 56 y.o. male  was evaluated in triage.  Pt complains of palpitations.  Patient states he was working in his garage this afternoon at approximately 430 and began to feel hot and felt that his chest was fluttering.  Patient has a history of atrial fibrillation requiring cardioversion in the past.  He was recently seen at the hospital where his atrial fibs/flutter converted on its own.  Patient Xarelto.  Patient was notified by primary care that his thyroid levels were abnormal.  Quick chart review shows that patient apparently has hyperthyroidism.  Patient denies chest pain, shortness of breath, abdominal pain, nausea, vomiting Review of Systems  Positive: Palpitations Negative: Chest pain, shortness of breath, nausea, vomiting  Physical Exam  BP (!) 141/115   Pulse (!) 29   Temp 98.5 F (36.9 C) (Oral)   Resp (!) 22   SpO2 98%  Gen:   Awake, no distress   Resp:  Normal effort  MSK:   Moves extremities without difficulty  Other:    Medical Decision Making  Medically screening exam initiated at 6:10 PM.  Appropriate orders placed.  Robert Braun was informed that the remainder of the evaluation will be completed by another provider, this initial triage assessment does not replace that evaluation, and the importance of remaining in the ED until their evaluation is complete.     Dorothyann Peng, PA-C 05/25/22 [phone]

## 2022-05-25 NOTE — Telephone Encounter (Signed)
Spoke with pt and pt scheduled for appt.

## 2022-05-26 ENCOUNTER — Other Ambulatory Visit: Payer: 59

## 2022-05-26 ENCOUNTER — Telehealth: Payer: Self-pay

## 2022-05-26 ENCOUNTER — Observation Stay (HOSPITAL_COMMUNITY): Payer: 59

## 2022-05-26 ENCOUNTER — Encounter (HOSPITAL_COMMUNITY): Payer: Self-pay | Admitting: Internal Medicine

## 2022-05-26 ENCOUNTER — Other Ambulatory Visit: Payer: Self-pay

## 2022-05-26 DIAGNOSIS — K7689 Other specified diseases of liver: Secondary | ICD-10-CM | POA: Diagnosis present

## 2022-05-26 DIAGNOSIS — Q249 Congenital malformation of heart, unspecified: Secondary | ICD-10-CM | POA: Diagnosis not present

## 2022-05-26 DIAGNOSIS — Z79899 Other long term (current) drug therapy: Secondary | ICD-10-CM | POA: Diagnosis not present

## 2022-05-26 DIAGNOSIS — Y92009 Unspecified place in unspecified non-institutional (private) residence as the place of occurrence of the external cause: Secondary | ICD-10-CM | POA: Diagnosis not present

## 2022-05-26 DIAGNOSIS — E875 Hyperkalemia: Secondary | ICD-10-CM | POA: Diagnosis present

## 2022-05-26 DIAGNOSIS — Z86010 Personal history of colonic polyps: Secondary | ICD-10-CM | POA: Diagnosis not present

## 2022-05-26 DIAGNOSIS — I10 Essential (primary) hypertension: Secondary | ICD-10-CM | POA: Diagnosis present

## 2022-05-26 DIAGNOSIS — D649 Anemia, unspecified: Secondary | ICD-10-CM | POA: Diagnosis present

## 2022-05-26 DIAGNOSIS — I480 Paroxysmal atrial fibrillation: Secondary | ICD-10-CM | POA: Diagnosis present

## 2022-05-26 DIAGNOSIS — Z8774 Personal history of (corrected) congenital malformations of heart and circulatory system: Secondary | ICD-10-CM | POA: Diagnosis not present

## 2022-05-26 DIAGNOSIS — E0580 Other thyrotoxicosis without thyrotoxic crisis or storm: Secondary | ICD-10-CM | POA: Diagnosis present

## 2022-05-26 DIAGNOSIS — I4891 Unspecified atrial fibrillation: Secondary | ICD-10-CM | POA: Diagnosis present

## 2022-05-26 DIAGNOSIS — R000 Tachycardia, unspecified: Secondary | ICD-10-CM | POA: Diagnosis present

## 2022-05-26 DIAGNOSIS — Z8249 Family history of ischemic heart disease and other diseases of the circulatory system: Secondary | ICD-10-CM | POA: Diagnosis not present

## 2022-05-26 DIAGNOSIS — E0590 Thyrotoxicosis, unspecified without thyrotoxic crisis or storm: Secondary | ICD-10-CM

## 2022-05-26 DIAGNOSIS — I083 Combined rheumatic disorders of mitral, aortic and tricuspid valves: Secondary | ICD-10-CM | POA: Diagnosis present

## 2022-05-26 DIAGNOSIS — Z7901 Long term (current) use of anticoagulants: Secondary | ICD-10-CM | POA: Diagnosis not present

## 2022-05-26 DIAGNOSIS — E039 Hypothyroidism, unspecified: Secondary | ICD-10-CM | POA: Diagnosis present

## 2022-05-26 DIAGNOSIS — Z5329 Procedure and treatment not carried out because of patient's decision for other reasons: Secondary | ICD-10-CM | POA: Diagnosis present

## 2022-05-26 DIAGNOSIS — Z87442 Personal history of urinary calculi: Secondary | ICD-10-CM | POA: Diagnosis not present

## 2022-05-26 DIAGNOSIS — Z87891 Personal history of nicotine dependence: Secondary | ICD-10-CM | POA: Diagnosis not present

## 2022-05-26 DIAGNOSIS — T462X5A Adverse effect of other antidysrhythmic drugs, initial encounter: Secondary | ICD-10-CM | POA: Diagnosis present

## 2022-05-26 DIAGNOSIS — I482 Chronic atrial fibrillation, unspecified: Secondary | ICD-10-CM | POA: Diagnosis present

## 2022-05-26 DIAGNOSIS — Z820 Family history of epilepsy and other diseases of the nervous system: Secondary | ICD-10-CM | POA: Diagnosis not present

## 2022-05-26 LAB — CBC
HCT: 34.6 % — ABNORMAL LOW (ref 39.0–52.0)
Hemoglobin: 11.9 g/dL — ABNORMAL LOW (ref 13.0–17.0)
MCH: 31 pg (ref 26.0–34.0)
MCHC: 34.4 g/dL (ref 30.0–36.0)
MCV: 90.1 fL (ref 80.0–100.0)
Platelets: 269 10*3/uL (ref 150–400)
RBC: 3.84 MIL/uL — ABNORMAL LOW (ref 4.22–5.81)
RDW: 11.4 % — ABNORMAL LOW (ref 11.5–15.5)
WBC: 6.3 10*3/uL (ref 4.0–10.5)
nRBC: 0 % (ref 0.0–0.2)

## 2022-05-26 LAB — COMPREHENSIVE METABOLIC PANEL
ALT: 20 U/L (ref 0–44)
AST: 18 U/L (ref 15–41)
Albumin: 3.4 g/dL — ABNORMAL LOW (ref 3.5–5.0)
Alkaline Phosphatase: 28 U/L — ABNORMAL LOW (ref 38–126)
Anion gap: 7 (ref 5–15)
BUN: 8 mg/dL (ref 6–20)
CO2: 28 mmol/L (ref 22–32)
Calcium: 9 mg/dL (ref 8.9–10.3)
Chloride: 103 mmol/L (ref 98–111)
Creatinine, Ser: 0.69 mg/dL (ref 0.61–1.24)
GFR, Estimated: >60 mL/min (ref >60)
Glucose, Bld: 108 mg/dL — ABNORMAL HIGH (ref 70–99)
Potassium: 3.7 mmol/L (ref 3.5–5.1)
Sodium: 138 mmol/L (ref 135–145)
Total Bilirubin: 0.8 mg/dL (ref 0.3–1.2)
Total Protein: 6 g/dL — ABNORMAL LOW (ref 6.5–8.1)

## 2022-05-26 LAB — HIV ANTIBODY (ROUTINE TESTING W REFLEX): HIV Screen 4th Generation wRfx: NONREACTIVE

## 2022-05-26 MED ORDER — RIVAROXABAN 20 MG PO TABS
20.0000 mg | ORAL_TABLET | Freq: Every day | ORAL | Status: DC
  Administered 2022-05-26 – 2022-05-30 (×5): 20 mg via ORAL
  Filled 2022-05-26 (×5): qty 1

## 2022-05-26 MED ORDER — LISINOPRIL 20 MG PO TABS
40.0000 mg | ORAL_TABLET | Freq: Every day | ORAL | Status: DC
  Administered 2022-05-27: 40 mg via ORAL
  Filled 2022-05-26 (×2): qty 2

## 2022-05-26 MED ORDER — BISOPROLOL FUMARATE 5 MG PO TABS
15.0000 mg | ORAL_TABLET | Freq: Every day | ORAL | Status: DC
  Administered 2022-05-27: 15 mg via ORAL
  Filled 2022-05-26: qty 3

## 2022-05-26 MED ORDER — METHIMAZOLE 10 MG PO TABS
10.0000 mg | ORAL_TABLET | Freq: Every day | ORAL | Status: DC
  Administered 2022-05-26 – 2022-05-30 (×5): 10 mg via ORAL
  Filled 2022-05-26 (×5): qty 1

## 2022-05-26 MED ORDER — BISOPROLOL FUMARATE 5 MG PO TABS
2.5000 mg | ORAL_TABLET | Freq: Every day | ORAL | Status: DC
  Administered 2022-05-26: 2.5 mg via ORAL
  Filled 2022-05-26: qty 1
  Filled 2022-05-26: qty 0.5

## 2022-05-26 MED ORDER — BISOPROLOL FUMARATE 5 MG PO TABS
5.0000 mg | ORAL_TABLET | Freq: Once | ORAL | Status: AC
  Administered 2022-05-26: 5 mg via ORAL
  Filled 2022-05-26: qty 1

## 2022-05-26 MED ORDER — PROPYLTHIOURACIL 50 MG PO TABS
100.0000 mg | ORAL_TABLET | Freq: Three times a day (TID) | ORAL | Status: DC
  Filled 2022-05-26 (×2): qty 2

## 2022-05-26 MED ORDER — BISOPROLOL FUMARATE 5 MG PO TABS
10.0000 mg | ORAL_TABLET | Freq: Every day | ORAL | Status: DC
  Administered 2022-05-26: 10 mg via ORAL
  Filled 2022-05-26: qty 2

## 2022-05-26 NOTE — Consult Note (Addendum)
Cardiology Consultation:   Robert Braun ID: Robert Braun MRN: [id number]; DOB: 1966/10/08  Admit date: 05/25/2022 Date of Consult: 05/26/2022  PCP:  Tammi Sou, MD   [facility] Providers Cardiologist:  None      Dr Michaelle Birks at [facility] 10/28/2021 Dr Gwenlyn Found saw in 2019 D. Kayleen Memos, NP saw 01/19/2019  Robert Braun Profile:   Robert Braun is a 56 y.o. 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  History of Present Illness:   Robert Braun was in [place] when Robert Braun saw Dr Michaelle Birks 10/2021. Robert Braun has had problems w/ bradycardia in the past when on Bystolic 10 mg. BP above target, spiro 25 mg qd was added  Robert Braun was in [place] in July and had Afib RVR >> Bystolic increased >> spont converted to SR. Continue Xarelto.  Had blood work thru his PCP, results concerning for hyperthyroid.  Robert Braun started having palpitations 08/07 and came to the ER, Robert Braun was in Afib RVR, and started on IV Cardizem. TSH very low and free T4 high >> amio held. Cards asked to see.   Robert Braun knew that Robert Braun was in atrial fibs when it started.  Robert Braun had palpitations, and was feeling a little weak.  Recently, Robert Braun has had episodes of feeling a little weak.  They would last a little while, then passed.  They are spontaneous and not associated with exertion.  Robert Braun never gets chest pain.  Robert Braun denies dyspnea on exertion, feels Robert Braun has a good fitness level.  Robert Braun denies lower extremity edema, orthopnea, or PND.  On his trip to [place], Robert Braun was working on his cabin which is in the [place] of [place].  Robert Braun was doing heavy work, without any chest pain or shortness of breath.  Robert Braun does worry a bit about working up there which is at least 40 minutes from the closest  [employer].   Past Medical History:  Diagnosis Date   Arthritis    foot   Atrial fibrillation ([place])    09/2018: Dr. Gwenlyn Found referred him to a-fib clinic for consideration or redo CV or ablation. After eval by a-fib clinic, Robert Braun was referred to [facility] to adult congenital specialist and EP: amiodarone started, cMRI to be repeated.   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  Also Systemic RV EF 41%, Mild systemic AR and mild/mod systemic AV valve regurg.  Normal fxn of subpulmonic LV.   History of adenomatous polyp of colon 09/2019   recall 09/2024   History of kidney stones    passed stone - no surgery   History of pneumonia 10/2017   Hypertension    Migraines    occasional   Recurrent abdominal pain 2020   RLQ; blood and imaging eval unremarkable for cause, colonoscopy showed adenoma x 1, otherwise normal    Past Surgical History:  Procedure Laterality Date   Cardiac MRI  06/2019   EF 55%   CARDIAC SURGERY     correction of transposition of the great arteries noted on cardiac MRI done in the [place]., pt denies heart surgery   CARDIOVERSION  03/2018   pt only briefly held sinus rhythm in [place]   CARDIOVERSION N/A 01/12/2019   Procedure: CARDIOVERSION;  Surgeon: Sanda Klein, MD;  Location: [facility];  Service: Cardiovascular;  Laterality: N/A;   COLONOSCOPY W/ POLYPECTOMY  09/2019   adenoma->recall 09/2024   TRANSTHORACIC ECHOCARDIOGRAM     in the [place]-->pt cannot recall any results.   TRANSTHORACIC ECHOCARDIOGRAM  09/2018   EF for both ventricles 25%, severe systemic AV (tricuspid) regurg--> pt ref'd to [facility] adult congential heart dz clinic   WISDOM TOOTH EXTRACTION       Home Medications:  Prior to Admission medications   Medication Sig Start Date End Date Taking? Authorizing Provider  amiodarone (PACERONE) 200 MG  tablet Take 0.5 tablets by mouth daily. 09/15/19  Yes [provider]  bisoprolol (ZEBETA) 10 MG tablet TAKE 1 TABLET BY MOUTH EVERY DAY Robert Braun taking differently: Take 10 mg by mouth daily. 04/28/22  Yes McGowen, Adrian Blackwater, MD  lisinopril (ZESTRIL) 40 MG tablet TAKE 1 TABLET (40 MG TOTAL) BY MOUTH DAILY IN THE AFTERNOON. Robert Braun taking differently: Take 40 mg by mouth daily. TAKE 1 TABLET (40 MG TOTAL) BY MOUTH DAILY IN THE AFTERNOON. 04/28/22  Yes McGowen, Adrian Blackwater, MD  Multiple Vitamin (MULTI-VITAMIN) tablet Take 1 tablet by mouth daily.   Yes [provider]  XARELTO 20 MG TABS tablet Take 20 mg by mouth daily. 02/03/21  Yes [provider]    Inpatient Medications: Scheduled Meds:  bisoprolol  10 mg Oral Daily   bisoprolol  2.5 mg Oral QHS   lisinopril  40 mg Oral Daily   methimazole  10 mg Oral Daily   rivaroxaban  20 mg Oral Daily   Continuous Infusions:  diltiazem (CARDIZEM) infusion 10 mg/hr (05/26/22 0311)   PRN Meds:   Allergies:   No Known Allergies  Social History:   Social History   Socioeconomic History   Marital status: Married    Spouse name: Not on file   Number of children: Not on file   Years of education: Not on file   Highest education level: Not on file  Occupational History   Not on file  Tobacco Use   Smoking status: Former    Years: 31.00    Types: Cigarettes    Quit date: 11/19/2013    Years since quitting: 8.5   Smokeless tobacco: Never  Vaping Use   Vaping Use: Every day  Substance and Sexual Activity   Alcohol use: Yes    Alcohol/week: 4.0 - 8.0 standard drinks of alcohol    Types: 4 - 8 Glasses of wine per week    Comment: 1-2 bottles wine on Weekends (Fri-Sun)   Drug use: Never   Sexual activity: Not on file  Other Topics Concern   Not on file  Social History Narrative   Married, 1 daughter.   Orig from [place].   Lived in [place] 20 yrs, then moved to [facility] 04/2018.   Occup: Homemaker.  Wife is VP of a  company.   Smoker until 2015-->switched to vap.   Alcohol: some beer and wine several days per week--sometimes >3 glasses of wine.   No drugs.   Social Determinants of Health   Financial Resource Strain: Not on file  Food Insecurity: Not on file  Transportation Needs: Not on file  Physical Activity: Not on file  Stress: Not on file  Social Connections: Not on file  Intimate Partner Violence: Not on file    Family  History:   Family History  Problem Relation Age of Onset   Parkinson's disease Mother    Hypertension Father    Colon cancer Neg Hx    Esophageal cancer Neg Hx    Rectal cancer Neg Hx    Stomach cancer Neg Hx      ROS:  Please see the history of present illness.  All other ROS reviewed and negative.     Physical Exam/Data:   Vitals:   05/26/22 0231 05/26/22 0238 05/26/22 0757 05/26/22 1121  BP:  (!) 103/58 112/67 106/68  Pulse:  60 72 65  Resp:   18 18  Temp:  98.7 F (37.1 C) 97.6 F (36.4 C) 98.8 F (37.1 C)  TempSrc:  Oral Oral Oral  SpO2:  99% 97% 99%  Weight: 85.9 kg     Height: 6' 4.75" (1.949 m)       Intake/Output Summary (Last 24 hours) at 05/26/2022 1642 Last data filed at 05/26/2022 1231 Gross per 24 hour  Intake 831.83 ml  Output --  Net 831.83 ml      05/26/2022    2:31 AM 05/19/2022   10:23 AM 04/07/2021    1:30 PM  Last 3 Weights  Weight (lbs) 189 lb 4.8 oz 193 lb 9.6 oz 203 lb  Weight (kg) 85.866 kg 87.816 kg 92.08 kg     Body mass index is 22.59 kg/m.  General:  Well nourished, well developed, in no acute distress HEENT: normal for age Neck: no JVD Vascular: No carotid bruits; Distal pulses 2+ bilaterally Cardiac:  normal S1, S2; Irreg R&R; soft murmur  Lungs:  clear to auscultation bilaterally, no wheezing, rhonchi or rales  Abd: soft, nontender, no hepatomegaly  Ext: no edema Musculoskeletal:  No deformities, BUE and BLE strength normal and equal Skin: warm and dry  Neuro:  CNs 2-12 intact, no focal abnormalities  noted Psych:  Normal affect   EKG:  The EKG was personally reviewed and demonstrates:  08/07 ECG is atrial fib, HR 97, LBBB w/ QRS 122 ms Telemetry:  Telemetry was personally reviewed and demonstrates:  atrial fib, RVR at times  Relevant CV Studies:  CARDIAC MRI: 10/23/2020 POSITION: Levocardia.   VISCERAL SITUS: The visceral situs is situs solitus.   ATRIAL SITUS: The atrial situs is situs solitus (S).   VENTRICULAR LOOPING: There is L-ventricular looping (L).   GREAT ARTERY ORIENTATION: There is L-transposition of the great arteries (L).   SYSTEMIC VENOUS ANATOMY: There is normal systemic venous anatomy.   PULMONARY VENOUS ANATOMY: Pulmonary venous anatomy is normal with all four pulmonary veins returning to the left atrium.   RIGHT ATRIUM: RA cavity size is normal.   LEFT ATRIUM: LA is severely enlarged.   LA/RA SEPTUM: The atrial septum is intact. A patent foramen ovale or small secundum atrial septal defect cannot be ruled  out.   TRICUSPID VALVE: The tricuspid valve is the left-sided atrioventricular valve. The tricuspid valve annulus is dilated. (4.0  cm) There is at least mild-moderate tricuspid regurgitation. There is no tricuspid stenosis.   MITRAL VALVE: The mitral valve is the right-sided atrioventricular valve. The mitral valve annulus is normal in size.  Mitral valve leaflets are normal. There is no mitral regurgitation. There is no mitral stenosis.   RIGHT VENTRICLE: The morphologic right ventricle is the left-sided and posterior ventricle. The right ventricle is the  sub-aortic (systemic) ventricle. There is severe RVH. RV is severely enlarged. There is reduced wall motion  in the septal  and inferior walls. RV systolic function is mildly reduced by subjective analysis.  Quantitative RVEF 59.0 %.   LEFT VENTRICLE: The morphologic left ventricle is the right-sided and anterior ventricle. The left ventricle is thinned  walled consistent with a sub-pulmonary  ventricle. LV cavity size is normal. LV systolic function is normal.  Quantitative LVEF 58.7 %.   LV/RV SEPTUM: The ventricular septum is intact.   CONOTRUNCAL ANATOMY: The aortic valve is anterior and leftward of the pulmonary valve consistent with L-transposition of the  great arteries.   OUTFLOW TRACT: There is no subpulmonary stenosis.   PULMONIC VALVE: The pulmonary valve is posterior and rightward. The pulmonic valve annulus is normal in size. Pulmonic  valve leaflets are normal. There is mild pulmonic regurgitation. There is no pulmonic stenosis.   AORTIC VALVE: The aortic valve is anterior and leftward. The aortic valve annulus is normal in size. Aortic valve is  trileaflet. There is moderate aortic regurgitation. There is no aortic stenosis. The aortic valve annulus  is normal in size. Aortic regurgitant fraction 15 %.   PULMONARY ARTERIES: The main and branch pulmonary arteries are dilated in size.  Main pulmonary artery: 3.0 x 2.8 cm.  Right pulmonary artery: 3.4 x 3.0 cm.  Left pulmonary artery: 2.8 x 2.6 cm.   AORTIC ARCH: The aortic arch is left sided. The aortic arch branching is normal. There is no evidence of coarctation of  the aorta. There is no patent ductus arteriosus.  Sinus of Valsalva (largest dimension, cross-sectional dimension): 3.8 x 3.5 cm  Sinotubular junction: 3.8 x 3.5 cm  Mid-ascending aorta:  3.8 x 3.5 cm  Proximal aortic arch (aorta at the origin of the innominate artery):  3.8 x 3.6 cm  Proximal transverse aortic arch: 3.5 x 3.4 cm  Mid-aortic arch (between left common carotid and subclavian arteries): 3.5 x 3.3 cm  Proximal descending thoracic aorta (begins at the isthmus, approximately 2 cm distal to the left subclavian  artery): 2.9 x 2.9 cm  Mid-descending aorta:  2.9 x 2.7 cm  Aorta at diaphragm:  2.5 x 2.5 cm   PERICARDIUM: There is no pericardial effusion.   PLEURAL EFFUSION: There is no pleural effusion.   VIABILITY: There is  nonspecific diffuse subendocardial hyperenhancement of the systemic morphologic right ventricle.  There is no evidence of thrombus.   AORTIC ROOT: The aortic root is mildly dilated. The ascending aorta is mild to moderately dilated.   OTHER FINDINGS: 2.1 x 1.8 x 1.6 cm liver cyst    ECHO: 10/11/2018 - Impressions: Patiet appears to have corrected transposition.    There is severe dilatation of the systemic (RV) ventical EF 25%.    Identified by modrator band and trabeculations. There is severe    systemic AV valve (TV) regurgitations (TV). Thee is severe LAE    and moderate RAE. There is mild to moderate AR and milld PR. The    great vessels run more parralel in nature with the PV maintaining    an anterior position. There is sever hypokineis of the non    systemic ventrical (LV) EF 25%. The Robert Braun appears to be in    atrial fibrillation. Situs appears normal. There is no ASD/PFO.       Given late presentation of advanced complex congental heart    disease complicated by afib would refer to academic congenital    heart center and f/u    with gated cardiac CTA and/or cardiac MRI.   Impressions:   -  Patiet appears to have corrected transposition. There is severe    dilatation of the systemic (RV) ventical EF 25%. Identified by    modrator band and trabeculations. There is severe systemic AV    valve (TV) regurgitations (TV). Thee is severe LAE and moderate    RAE. There is mild to moderate AR and milld PR. The great vessels    run more parralel in nature with the PV maintaining an anterior    position. There is sever hypokineis of the non systemic ventrical    (LV) EF 25%. The Robert Braun appears to be in atrial fibrillation.    Situs appears normal. There is no ASD/PFO.       Given late presentation of advanced complex congental heart    disease complicated by afib would refer to academic congenital    heart center and f/u    with gated cardiac CTA and/or cardiac MRI.   Laboratory  Data:  High Sensitivity Troponin:   Recent Labs  Lab 05/25/22 1815 05/25/22 2007  TROPONINIHS 14 27*     Chemistry Recent Labs  Lab 05/21/22 0930 05/25/22 1815 05/26/22 0724  NA 134* 131* 138  K 5.2 No hemolysis seen* 4.1 3.7  CL 98 98 103  CO2 '30 25 28  '$ GLUCOSE 109* 116* 108*  BUN '8 14 8  '$ CREATININE 0.77 0.78 0.69  CALCIUM 9.4 9.3 9.0  GFRNONAA  --  >60 >60  ANIONGAP  --  8 7    Recent Labs  Lab 05/21/22 0930 05/25/22 1815 05/26/22 0724  PROT 6.7 7.1 6.0*  ALBUMIN 4.2 4.1 3.4*  AST '14 18 18  '$ ALT '17 22 20  '$ ALKPHOS 34* 37* 28*  BILITOT 0.4 0.6 0.8   Lipids  Recent Labs  Lab 05/21/22 0930  CHOL 136  TRIG 101.0  HDL 49.60  LDLCALC 66  CHOLHDL 3    Hematology Recent Labs  Lab 05/21/22 0930 05/25/22 1815 05/26/22 0724  WBC 6.4 8.2 6.3  RBC 4.01* 4.02* 3.84*  HGB 12.7* 12.5* 11.9*  HCT 37.5* 36.4* 34.6*  MCV 93.4 90.5 90.1  MCH  --  31.1 31.0  MCHC 34.0 34.3 34.4  RDW 12.5 11.3* 11.4*  PLT 232.0 275 269   Thyroid  Recent Labs  Lab 05/21/22 0930 05/22/22 1520  TSH <0.01*  --   FREET4  --  3.46*    T3, Free  Date Value Ref Range Status  05/22/2022 6.8 (H) 2.3 - 4.2 pg/mL Final   Free T4  Date Value Ref Range Status  05/22/2022 3.46 (H) 0.60 - 1.60 ng/dL Final    Comment:    Specimens from patients who are undergoing biotin therapy and /or ingesting biotin supplements may contain high levels of biotin.  The higher biotin concentration in these specimens interferes with this Free T4 assay.  Specimens that contain high levels  of biotin may cause false high results for this Free T4 assay.  Please interpret results in light of the total clinical presentation of the Robert Braun.      BNPNo results for input(s): "BNP", "PROBNP" in the last 168 hours.  DDimer No results for input(s): "DDIMER" in the last 168 hours.   Radiology/Studies:  US THYROID  Result Date: 05/26/2022 CLINICAL DATA:  Hyperthyroidism EXAM: THYROID ULTRASOUND TECHNIQUE:  Ultrasound examination of the thyroid gland and adjacent soft tissues was performed. COMPARISON:  None available FINDINGS: Parenchymal Echotexture: Mildly heterogenous Isthmus: 0.5 cm Right lobe: 6 x 3 x 2.3 x 2.3 cm Left lobe:  6.1 x 1.9 x 2.7 cm _________________________________________________________ Estimated total number of nodules >/= 1 cm: 0 Number of spongiform nodules >/=  2 cm not described below (TR1): 0 Number of mixed cystic and solid nodules >/= 1.5 cm not described below ([place]): 0 _________________________________________________________ No discrete nodules are seen within the thyroid gland. IMPRESSION: Mild diffuse heterogeneous thyroid parenchyma without discrete nodule. The above is in keeping with the ACR TI-RADS recommendations - J Am Coll Radiol 2017;14:587-595. Electronically Signed   By: Miachel Roux M.D.   On: 05/26/2022 08:59   DG Chest 1 View  Result Date: 05/25/2022 CLINICAL DATA:  Palpitations. EXAM: CHEST  1 VIEW COMPARISON:  None Available. FINDINGS: The heart size and mediastinal contours are within normal limits. Both lungs are clear. The visualized skeletal structures are unremarkable. IMPRESSION: No active disease. Electronically Signed   By: Virgina Norfolk M.D.   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  Robert Braun has no heart failure and no history of coronary artery disease, so we have multiple options. -Of note, Robert Braun has appointments to see his doctors at [facility] within the next month.   Dr. Baron Hamper is at or close to retirement, but Robert Braun has Dr. Mylinda Latina and Parke Simmers, PA-C for electrophysiology.  -  Robert Braun was supposed to get an echo after his last appointment with Dr. Baron Hamper in January 2023, but because of his prolonged travel, Robert Braun has not had it yet. -Per Dr. Chrisandra Netters note, Robert Braun sees Robert Braun on 8/23, per a later note, Robert Braun is supposed to get an echo on 8/15 at 10:30 AM and then see the congenital disease clinic at 2 PM the appointment with Dr. Marcello Moores  -A good option might be to pursue rate control as long as the Robert Braun is not too symptomatic and have him follow-up with his physicians at [facility] as scheduled. - IM has restarted his bisoprolol at full dose of 10 mg qd - MD to discuss w/ pt and decide on changing Cardizem to CD 120 mg qd.  2.  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 -Thyroid stimulating immunoglobulin and thyroid peroxidase antibody as well as thyrotropin receptor auto abs are pending. -Once these are reviewed, the Internal Medicine doctors will have a better idea of how to manage this  3.  Congenitally corrected transposition of the great arteries -This is a situation in which despite anatomical aberrations in the form of atrioventricular and ventricular arterial discordance, there can be physiologically normal circulation without mixing or shunting of blood. -The atria are in normal position with the left ventricle on the right side.  The great vessels are L-malposed with the aorta anterior into the left of the main pulmonary artery.  The interventricular septum lies in a straight anteroposterior plane with side-by-side ventricles.  The conduction pathway is abnormal with the atrioventricular node lying anteriorly at the junction of the right sided mitral valve annulus and the limbus of  the septum secundum. -The atrioventricular bundle then transverses the anterior wall of the  morphological LV outflow tract just caudal to the pulmonary valve annulus.  It then dives into the interventricular septum lying subendocardial late on the right side of the septum i.e. the morphological LV.  The bundle this lies in relation to the anterior superior border of the ventricular septal defect when present. - Robert Braun has NOT had surgery and Dr. Chrisandra Netters notes do not indicate that there is a need for it.  Otherwise, per IM   Risk Assessment/Risk Scores:        CHA2DS2-VASc Score = 1   This indicates a 0.6% annual risk of stroke. The Robert Braun's score is based upon: CHF History: 0 HTN History: 1 Diabetes History: 0 Stroke History: 0 Vascular Disease History: 0 Age Score: 0 Gender Score: 0     For questions or updates, please contact [place] Please consult [email or web address] for contact info under    Signed, Rosaria Ferries, PA-C  05/26/2022 4:42 PM   Agree with note by Rosaria Ferries PA-C  Robert Braun well-known to me.  56 year old married Caucasian male with a history of congenitally corrected transposition of the great vessels.  I last saw him 4 years ago for atrial fibrillation.  Robert Braun has been on Xarelto uninterrupted as well as amiodarone.  Robert Braun is [facility] at [facility].  Robert Braun has no other medical problems.  Robert Braun recently was in [place] and had a brief episode of A-fib which converted spontaneously.  Robert Braun went into A-fib yesterday but it does not feel clinically ill.  Robert Braun does have A-fib with RVR.  His last dose of amiodarone 100 mg a day was yesterday.  Robert Braun is hyperthyroid by labs.  Robert Braun is on a Cardizem drip with a heart heart rate in the 80-100 range.  Robert Braun is on bisoprolol 12.5 mg a day.  At this point, it does not make sense to cardiovert him given his hyperthyroid state.  I would opt for rate control by increasing his bisoprolol, weaning him off IV diltiazem and adding p.o. Cardizem for additional rate control if necessary.  I will discharge him home once his  ventricular response is controlled.  Robert Braun has an appointment at [facility] on 06/02/2022.   Lorretta Harp, M.D., [place], [facility], [place] [address] [place],   [id number]  [phone] 05/26/2022 5:44 PM

## 2022-05-26 NOTE — Plan of Care (Signed)
  Problem: Clinical Measurements: Goal: Cardiovascular complication will be avoided Outcome: Progressing   Problem: Activity: Goal: Risk for activity intolerance will decrease Outcome: Progressing   Problem: Coping: Goal: Level of anxiety will decrease Outcome: Progressing   Problem: Safety: Goal: Ability to remain free from injury will improve Outcome: Progressing   

## 2022-05-26 NOTE — Progress Notes (Signed)
  Transition of Care [facility]) Screening Note   Patient Details  Name: Robert Braun Date of Birth: 1965/11/08   Transition of Care [facility]) CM/SW Contact:    Bethann Berkshire, [place] Phone Number: 05/26/2022, 11:14 AM    Transition of Care Department [facility]) has reviewed patient and no TOC needs have been identified at this time. We will continue to monitor patient advancement through interdisciplinary progression rounds. If new patient transition needs arise, please place a TOC consult.

## 2022-05-26 NOTE — H&P (Addendum)
History and Physical    Robert Braun DTO:[id number] DOB: 04/18/1966 DOA: 05/25/2022  PCP: Tammi Sou, MD  Patient coming from: Home.  Chief Complaint: Palpitations.  HPI: Robert Braun is a 56 y.o. 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  Denies any chest pain or shortness of breath.  Patient recently had followed up with his primary care physician and blood work showed features concerning for hyperthyroidism.  Patient states last month he was in [place] when he had an episode of A-fib with RVR when he had gone to the ER over there and his Bystolic dose was increased.  He converted to normal sinus rhythm without any intervention.  Takes Xarelto daily in the morning.  ED Course: In the ER patient is found to be in A-fib with RVR.  Patient at this time has declined cardioversion since patient recently had A-fib with RVR in [place] and had reverted back to normal sinus without any intervention.  At the time of his Bystolic dose was increased.  At this time patient was started on Cardizem infusion.  Thyroid function test showed features concerning for hyperthyroidism.   Review of Systems: As per HPI, rest all negative.   Past Medical History:  Diagnosis Date   Arthritis    foot   Atrial fibrillation ([place])    09/2018: Dr. Gwenlyn Found referred him to a-fib clinic for consideration or redo CV or ablation. After eval by a-fib clinic, he was referred to [facility] to adult congenital specialist and EP: amiodarone started, cMRI to be repeated.   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  Also Systemic RV EF 41%, Mild systemic AR and mild/mod systemic AV valve regurg.  Normal fxn of subpulmonic  LV.   History of adenomatous polyp of colon 09/2019   recall 09/2024   History of kidney stones    passed stone - no surgery   History of pneumonia 10/2017   Hypertension    Migraines    occasional   Recurrent abdominal pain 2020   RLQ; blood and imaging eval unremarkable for cause, colonoscopy showed adenoma x 1, otherwise normal    Past Surgical History:  Procedure Laterality Date   Cardiac MRI  06/2019   EF 55%   CARDIAC SURGERY     correction of transposition of the great arteries noted on cardiac MRI done in the [place]., pt denies heart surgery   CARDIOVERSION  03/2018   pt only briefly held sinus rhythm in [place]   CARDIOVERSION N/A 01/12/2019   Procedure: CARDIOVERSION;  Surgeon: Sanda Klein, MD;  Location: [place];  Service: Cardiovascular;  Laterality: N/A;   COLONOSCOPY W/ POLYPECTOMY  09/2019   adenoma->recall 09/2024   TRANSTHORACIC ECHOCARDIOGRAM     in the [place]-->pt cannot recall any results.   TRANSTHORACIC ECHOCARDIOGRAM  09/2018   EF for both ventricles 25%, severe systemic AV (tricuspid) regurg--> pt ref'd to [facility] adult congential heart dz clinic   WISDOM TOOTH EXTRACTION       reports that he quit smoking about 8 years ago. His smoking use included cigarettes. He has never used smokeless tobacco. He reports current alcohol use of about 4.0 - 8.0 standard drinks of alcohol per week. He reports that he does  not use drugs.  No Known Allergies  Family History  Problem Relation Age of Onset   Parkinson's disease Mother    Hypertension Father    Colon cancer Neg Hx    Esophageal cancer Neg Hx    Rectal cancer Neg Hx    Stomach cancer Neg Hx     Prior to Admission medications   Medication Sig Start Date End Date Taking? Authorizing Provider  amiodarone (PACERONE) 200 MG tablet Take 0.5 tablets by mouth daily. 09/15/19  Yes [provider]  bisoprolol (ZEBETA) 10 MG tablet TAKE 1 TABLET BY MOUTH EVERY DAY Patient taking differently: Take 10 mg by  mouth daily. 04/28/22  Yes McGowen, Adrian Blackwater, MD  lisinopril (ZESTRIL) 40 MG tablet TAKE 1 TABLET (40 MG TOTAL) BY MOUTH DAILY IN THE AFTERNOON. Patient taking differently: Take 40 mg by mouth daily. TAKE 1 TABLET (40 MG TOTAL) BY MOUTH DAILY IN THE AFTERNOON. 04/28/22  Yes McGowen, Adrian Blackwater, MD  Multiple Vitamin (MULTI-VITAMIN) tablet Take 1 tablet by mouth daily.   Yes [provider]  XARELTO 20 MG TABS tablet Take 20 mg by mouth daily. 02/03/21  Yes [provider]    Physical Exam: Constitutional: Moderately built and nourished. Vitals:   05/26/22 0110 05/26/22 0129 05/26/22 0130 05/26/22 0145  BP: 118/66  109/72 129/86  Pulse: 99  96 96  Resp: '20  18 14  '$ Temp:  98.1 F (36.7 C)    TempSrc:  Oral    SpO2: 99%  97% 99%   Eyes: Anicteric no pallor. ENMT: No discharge from the ears eyes nose and mouth. Neck: No mass felt.  No neck rigidity. Respiratory: No rhonchi or crepitations. Cardiovascular: S1-S2 heard. Abdomen: Soft nontender bowel sound present. Musculoskeletal: No edema. Skin: No rash. Neurologic: Alert awake oriented time place and person.  Moves all extremities. Psychiatric: Appears normal.  Normal affect.   Labs on Admission: I have personally reviewed following labs and imaging studies  CBC: Recent Labs  Lab 05/21/22 0930 05/25/22 1815  WBC 6.4 8.2  NEUTROABS 3.4  --   HGB 12.7* 12.5*  HCT 37.5* 36.4*  MCV 93.4 90.5  PLT 232.0 182   Basic Metabolic Panel: Recent Labs  Lab 05/21/22 0930 05/25/22 1815  NA 134* 131*  K 5.2 No hemolysis seen* 4.1  CL 98 98  CO2 30 25  GLUCOSE 109* 116*  BUN 8 14  CREATININE 0.77 0.78  CALCIUM 9.4 9.3   GFR: Estimated Creatinine Clearance: 129.6 mL/min (by C-G formula based on SCr of 0.78 mg/dL). Liver Function Tests: Recent Labs  Lab 05/21/22 0930 05/25/22 1815  AST 14 18  ALT 17 22  ALKPHOS 34* 37*  BILITOT 0.4 0.6  PROT 6.7 7.1  ALBUMIN 4.2 4.1   No results for input(s): "LIPASE",  "AMYLASE" in the last 168 hours. No results for input(s): "AMMONIA" in the last 168 hours. Coagulation Profile: No results for input(s): "INR", "PROTIME" in the last 168 hours. Cardiac Enzymes: No results for input(s): "CKTOTAL", "CKMB", "CKMBINDEX", "TROPONINI" in the last 168 hours. BNP (last 3 results) No results for input(s): "PROBNP" in the last 8760 hours. HbA1C: No results for input(s): "HGBA1C" in the last 72 hours. CBG: No results for input(s): "GLUCAP" in the last 168 hours. Lipid Profile: No results for input(s): "CHOL", "HDL", "LDLCALC", "TRIG", "CHOLHDL", "LDLDIRECT" in the last 72 hours. Thyroid Function Tests: No results for input(s): "TSH", "T4TOTAL", "FREET4", "T3FREE", "THYROIDAB" in the last 72 hours. Anemia Panel: No results  for input(s): "VITAMINB12", "FOLATE", "FERRITIN", "TIBC", "IRON", "RETICCTPCT" in the last 72 hours. Urine analysis:    Component Value Date/Time   BILIRUBINUR Negative 10/18/2018 1213   PROTEINUR Negative 10/18/2018 1213   UROBILINOGEN 0.2 10/18/2018 1213   NITRITE Negative 10/18/2018 1213   LEUKOCYTESUR Negative 10/18/2018 1213   Sepsis Labs: '@LABRCNTIP'$ (procalcitonin:4,lacticidven:4) )No results found for this or any previous visit (from the past 240 hour(s)).   Radiological Exams on Admission: DG Chest 1 View  Result Date: 05/25/2022 CLINICAL DATA:  Palpitations. EXAM: CHEST  1 VIEW COMPARISON:  None Available. FINDINGS: The heart size and mediastinal contours are within normal limits. Both lungs are clear. The visualized skeletal structures are unremarkable. IMPRESSION: No active disease. Electronically Signed   By: Virgina Norfolk M.D.   On: 05/25/2022 18:55    EKG: Independently reviewed.  A-fib with RVR.  Assessment/Plan Principal Problem:   Atrial fibrillation with RVR (HCC) Active Problems:   Adult congenital heart disease   Hyperthyroidism    A-fib with RVR -patient has been started on Cardizem infusion.  We will  continue patient's Bystolic home dose.  Thyroid function test abnormal.  Discussed with cardiologist on-call at this time plan is to hold amiodarone.  On Xarelto for anticoagulation.  Will await formal consultation with cardiology. Hyperthyroidism -not sure if patient has Graves' disease or amiodarone induced thyrotoxicosis.  Hold amiodarone for now.  Will check thyrotropin receptor antibodies,TSI  and also TPO antibodies.  We will check ultrasound of the thyroid to check for vascularity and also for nodules.  If there is no increased vascularity then it will be a type II amiodarone induced thyrotoxicosis if the antibodies are negative for which patient may need prednisone.  For now we will start patient on Tapazole.  I asked pharmacy to help with dosing.  Closely monitor LFTs and CBC.  Follow-up pending labs with antibody test to make sure it is not Graves' disease or amiodarone induced.  Patient has agreed with getting Tapazole. Patient is already on Bystolic (beta-blockers).  If patient has amiodarone induced thyroid toxicosis then will need prednisone. History of transposition of great vessels status post surgery. History of RV dysfunction and tricuspid regurgitation.  Patient is on lisinopril.  Recent labs showed hyperkalemia and patient was instructed to stop the spironolactone. Anemia appears to be chronic when compared to labs done in August 2022 follow CBC.  Check anemia panel with next blood draw.   DVT prophylaxis: Xarelto. Code Status: Full code. Family Communication: Discussed with patient. Disposition Plan: Home. Consults called: Discussed with cardiologist.  Will need to discuss with electrolytes in the morning.  Formal consult with cardiology. Admission status: Observation.   Rise Patience MD [facility] Pager [phone].  If 7PM-7AM, please contact night-coverage [email or web address] Password [email or web address]  05/26/2022, 1:57 AM

## 2022-05-26 NOTE — Progress Notes (Signed)
   05/26/22 1400  Mobility  Activity Ambulated independently in hallway  Level of Assistance Independent  Assistive Device None  Distance Ambulated (ft) 1100 ft  Activity Response Tolerated well  $Mobility charge 1 Mobility   Mobility Specialist Progress Note  Pt was in bed and agreeable. Left EOB w/ all needs met & call bell in reach.   Lucious Groves Mobility Specialist

## 2022-05-26 NOTE — ED Notes (Signed)
Hospitalist at bedside 

## 2022-05-26 NOTE — Progress Notes (Addendum)
This is a 56 year old gentleman with history of atrial fibrillation and transposition of great arteries s/p surgery presented to ER with a complaint of palpitation and was diagnosed with atrial fibrillation with RVR.  He was initially started on Cardizem infusion.  Subsequently he was started on his home dose of Bystolic.  Rates were controlled.  He was on amiodarone for last 3 years for atrial fibrillation.  He was recently in [place] where he had atrial fibrillation with RVR and he converted back to sinus rhythm.  He was given the option by hospitalist yesterday for cardioversion and he did not want to do that.  Also, he was recently diagnosed with hyperthyroidism with low TSH at his PCPs office last week.  For this reason, his amiodarone was held.  I have consulted cardiology.  As of 5:15 PM, I have touched base with cardiology, they are still not sure what the plan is, waiting for their recommendations.  Patient is totally asymptomatic.  Multiple labs have been sent for the work-up of hyperthyroidism.  He is already started on methimazole.  Addendum: Received message from cardiology, they recommend weaning him off of Cardizem drip and increasing Bystolic to 15 mg, he only received 10 mg today.  We will start her on 15 tomorrow.  Try to wean him off of Cardizem.  Per cardiology, his rates might not be controlled well until his hyperthyroidism is controlled.

## 2022-05-26 NOTE — ED Notes (Signed)
Patient ambulated to the bathroom without assistance.

## 2022-05-26 NOTE — Telephone Encounter (Signed)
FYI. Please see below.  Patient called because he went to the hospital [facility]) where he is currently because of him having a-fib, heart rate is not the best. They kept him over night and will do the labs there because unsure if all this is caused by this thyroid level. He missed his scheduled lab visit.

## 2022-05-27 ENCOUNTER — Inpatient Hospital Stay (HOSPITAL_COMMUNITY): Payer: 59

## 2022-05-27 DIAGNOSIS — I4891 Unspecified atrial fibrillation: Secondary | ICD-10-CM

## 2022-05-27 LAB — ECHOCARDIOGRAM COMPLETE
AV Vena cont: 0.4 cm
Calc EF: 44.7 %
Height: 76.75 in
P 1/2 time: 552 msec
S' Lateral: 5.7 cm
Single Plane A2C EF: 52.3 %
Single Plane A4C EF: 38 %
Weight: 2982.4 oz

## 2022-05-27 LAB — THYROID STIMULATING IMMUNOGLOBULIN: Thyroid Stimulating Immunoglob: <0.1 IU/L (ref 0.00–0.55)

## 2022-05-27 LAB — THYROID PEROXIDASE ANTIBODY: Thyroperoxidase Ab SerPl-aCnc: <9 IU/mL (ref 0–34)

## 2022-05-27 LAB — THYROTROPIN RECEPTOR AUTOABS: Thyrotropin Receptor Ab: <1.1 IU/L (ref 0.00–1.75)

## 2022-05-27 MED ORDER — BISOPROLOL FUMARATE 5 MG PO TABS
5.0000 mg | ORAL_TABLET | Freq: Every day | ORAL | Status: DC
Start: 2022-05-27 — End: 2022-05-28
  Administered 2022-05-27: 5 mg via ORAL
  Filled 2022-05-27: qty 1

## 2022-05-27 MED ORDER — BISOPROLOL FUMARATE 5 MG PO TABS
10.0000 mg | ORAL_TABLET | Freq: Every day | ORAL | Status: DC
  Administered 2022-05-28: 10 mg via ORAL
  Filled 2022-05-27: qty 2

## 2022-05-27 MED ORDER — DILTIAZEM HCL ER COATED BEADS 120 MG PO CP24
120.0000 mg | ORAL_CAPSULE | Freq: Every day | ORAL | Status: DC
Start: 2022-05-27 — End: 2022-05-27

## 2022-05-27 NOTE — Progress Notes (Signed)
  Echocardiogram 2D Echocardiogram has been performed.  Robert Braun 05/27/2022, 1:17 PM

## 2022-05-27 NOTE — Progress Notes (Addendum)
Progress Note  Patient Name: Robert Braun Date of Encounter: 05/27/2022  [facility] Cardiologist: None   Subjective   No complaints this morning. HR is better controlled, rates in the 70-80s.   Inpatient Medications    Scheduled Meds:  bisoprolol  15 mg Oral Daily   lisinopril  40 mg Oral Daily   methimazole  10 mg Oral Daily   rivaroxaban  20 mg Oral Daily   Continuous Infusions:  PRN Meds:    Vital Signs    Vitals:   05/26/22 1121 05/26/22 1642 05/26/22 1920 05/27/22 0435  BP: 106/68 121/70 113/76 101/61  Pulse: 65 81 90 88  Resp: '18 18 18 18  '$ Temp: 98.8 F (37.1 C) 98.8 F (37.1 C) 98.2 F (36.8 C) 98.2 F (36.8 C)  TempSrc: Oral Oral Oral Oral  SpO2: 99% 99% 99% 96%  Weight:    84.6 kg  Height:        Intake/Output Summary (Last 24 hours) at 05/27/2022 1012 Last data filed at 05/27/2022 0855 Gross per 24 hour  Intake 1020 ml  Output --  Net 1020 ml      05/27/2022    4:35 AM 05/26/2022    2:31 AM 05/19/2022   10:23 AM  Last 3 Weights  Weight (lbs) 186 lb 6.4 oz 189 lb 4.8 oz 193 lb 9.6 oz  Weight (kg) 84.55 kg 85.866 kg 87.816 kg      Telemetry    Afib rates 70-80 - Personally Reviewed  ECG    No new tracing  Physical Exam   GEN: No acute distress.   Neck: No JVD Cardiac: Irreg Irreg, no murmurs, rubs, or gallops.  Respiratory: Clear to auscultation bilaterally. GI: Soft, nontender, non-distended  MS: No edema; No deformity. Neuro:  Nonfocal  Psych: Normal affect   Labs    High Sensitivity Troponin:   Recent Labs  Lab 05/25/22 1815 05/25/22 2007  TROPONINIHS 14 27*     Chemistry Recent Labs  Lab 05/21/22 0930 05/25/22 1815 05/26/22 0724  NA 134* 131* 138  K 5.2 No hemolysis seen* 4.1 3.7  CL 98 98 103  CO2 '30 25 28  '$ GLUCOSE 109* 116* 108*  BUN '8 14 8  '$ CREATININE 0.77 0.78 0.69  CALCIUM 9.4 9.3 9.0  PROT 6.7 7.1 6.0*  ALBUMIN 4.2 4.1 3.4*  AST '14 18 18  '$ ALT '17 22 20  '$ ALKPHOS 34* 37* 28*  BILITOT 0.4 0.6 0.8   GFRNONAA  --  >60 >60  ANIONGAP  --  8 7    Lipids  Recent Labs  Lab 05/21/22 0930  CHOL 136  TRIG 101.0  HDL 49.60  LDLCALC 66  CHOLHDL 3    Hematology Recent Labs  Lab 05/21/22 0930 05/25/22 1815 05/26/22 0724  WBC 6.4 8.2 6.3  RBC 4.01* 4.02* 3.84*  HGB 12.7* 12.5* 11.9*  HCT 37.5* 36.4* 34.6*  MCV 93.4 90.5 90.1  MCH  --  31.1 31.0  MCHC 34.0 34.3 34.4  RDW 12.5 11.3* 11.4*  PLT 232.0 275 269   Thyroid  Recent Labs  Lab 05/21/22 0930 05/22/22 1520  TSH <0.01*  --   FREET4  --  3.46*    BNPNo results for input(s): "BNP", "PROBNP" in the last 168 hours.  DDimer No results for input(s): "DDIMER" in the last 168 hours.   Radiology    US THYROID  Result Date: 05/26/2022 CLINICAL DATA:  Hyperthyroidism EXAM: THYROID ULTRASOUND TECHNIQUE: Ultrasound examination of the thyroid  gland and adjacent soft tissues was performed. COMPARISON:  None available FINDINGS: Parenchymal Echotexture: Mildly heterogenous Isthmus: 0.5 cm Right lobe: 6 x 3 x 2.3 x 2.3 cm Left lobe: 6.1 x 1.9 x 2.7 cm _________________________________________________________ Estimated total number of nodules >/= 1 cm: 0 Number of spongiform nodules >/=  2 cm not described below (TR1): 0 Number of mixed cystic and solid nodules >/= 1.5 cm not described below (TR2): 0 _________________________________________________________ No discrete nodules are seen within the thyroid gland. IMPRESSION: Mild diffuse heterogeneous thyroid parenchyma without discrete nodule. The above is in keeping with the ACR TI-RADS recommendations - J Am Coll Radiol 2017;14:587-595. Electronically Signed   By: Miachel Roux M.D.   On: 05/26/2022 08:59   DG Chest 1 View  Result Date: 05/25/2022 CLINICAL DATA:  Palpitations. EXAM: CHEST  1 VIEW COMPARISON:  None Available. FINDINGS: The heart size and mediastinal contours are within normal limits. Both lungs are clear. The visualized skeletal structures are unremarkable. IMPRESSION: No  active disease. Electronically Signed   By: Virgina Norfolk M.D.   On: 05/25/2022 18:55    Cardiac Studies   Echo: pending  Patient Profile     56 y.o. 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  He is on bisoprolol 15 mg a day in divided doses.  I have discontinued his IV diltiazem.  He is on Xarelto which has been uninterrupted.  He is totally asymptomatic.  2D echo is pending.  Will see what his heart rate does off of the IV diltiazem and make further recommendations with regards to rate control.  He is significantly hyperthyroid.  He is off his amiodarone for several days.  He has up an appointment at [facility] on 06/02/2022.  Lorretta Harp, M.D., [place], [facility], [place] [address] [place], [place]  [address]  [phone] 05/27/2022 10:50 AM

## 2022-05-27 NOTE — Progress Notes (Signed)
PROGRESS NOTE    Robert Braun  XBM:[id number] DOB: Jun 04, 1966 DOA: 05/25/2022 PCP: Tammi Sou, MD  Brief Narrative:Robert Braun is a 56 y.o. 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  Denies any chest pain or shortness of breath.  Patient recently had followed up with his primary care physician and blood work showed features concerning for hyperthyroidism.   Patient states last month he was in [place] when he had an episode of A-fib with RVR when he had gone to the ER over there and his Bystolic dose was increased.  He converted to normal sinus rhythm without any intervention.  Takes Xarelto daily in the morning.   ED Course: In the ER patient is found to be in A-fib with RVR.  Patient at this time has declined cardioversion since patient recently had A-fib with RVR in [place] and had reverted back to normal sinus without any intervention.  At the time of his Bystolic dose was increased.  At this time patient was started on Cardizem infusion.  Thyroid function test showed features concerning for hyperthyroidism.   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  Amiodarone was stopped on admission.  Patient was already taking Bystolic at home at 15 mg daily which is continued.  He was initially started on Cardizem drip which was stopped today at 05/27/2022.  Continuing Bystolic and Xarelto.  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  #3 anemia chronic stable   Estimated body mass index is 22.25 kg/m as calculated from the following:   Height as of this encounter: 6' 4.75" (1.949 m).   Weight as of this encounter: 84.6 kg.  DVT prophylaxis: Xarelto  code Status: Full code  family Communication: None at bedside  disposition Plan:  Status is: Inpatient Remains inpatient appropriate because: A-fib rapid   Consultants:  Cardiology  Procedures: None  antimicrobials: None  Subjective: Patient is resting in bed he is kind of anxious denies chest pain or palpitations  Objective: Vitals:   05/26/22 1642 05/26/22 1920 05/27/22 0435 05/27/22 1152  BP: 121/70 113/76 101/61 112/79  Pulse: 81 90 88 88  Resp: '18 18 18 20  '$ Temp: 98.8 F (37.1 C) 98.2 F (36.8 C) 98.2 F (36.8 C) 98.1 F (36.7 C)  TempSrc: Oral Oral Oral Oral  SpO2: 99% 99% 96% 100%  Weight:   84.6 kg   Height:        Intake/Output Summary (Last 24 hours) at 05/27/2022 1526 Last data filed at 05/27/2022 1243 Gross per 24 hour  Intake 1076 ml  Output --  Net 1076 ml   Filed Weights   05/26/22 0231 05/27/22 0435  Weight: 85.9 kg 84.6 kg    Examination:  General exam: Appears calm and comfortable  Respiratory system: Clear to auscultation. Respiratory effort normal. Cardiovascular system: S1 & S2 heard, RRR. No JVD, murmurs, rubs, gallops or clicks. No pedal edema. Gastrointestinal system: Abdomen is nondistended, soft and nontender.  No organomegaly or masses felt. Normal bowel sounds heard. Central nervous system: Alert and oriented. No focal neurological deficits. Extremities: Symmetric 5 x 5 power. Skin: No rashes, lesions or ulcers Psychiatry: Judgement and insight appear normal. Mood & affect appropriate.     Data Reviewed: I have personally reviewed following labs and imaging studies  CBC: Recent Labs  Lab 05/21/22 0930  05/25/22 1815 05/26/22 0724  WBC 6.4 8.2 6.3  NEUTROABS 3.4  --   --   HGB 12.7* 12.5* 11.9*  HCT 37.5* 36.4* 34.6*  MCV 93.4 90.5 90.1  PLT 232.0 275 540   Basic Metabolic Panel: Recent Labs  Lab 05/21/22 0930 05/25/22 1815 05/26/22 0724  NA 134* 131* 138  K 5.2 No hemolysis seen* 4.1 3.7  CL 98 98 103  CO2 '30 25 28  '$ GLUCOSE 109* 116* 108*  BUN '8 14 8  '$ CREATININE 0.77 0.78 0.69  CALCIUM 9.4 9.3 9.0   GFR: Estimated Creatinine Clearance: 124.8 mL/min (by C-G formula based on SCr of 0.69 mg/dL). Liver Function Tests: Recent Labs  Lab 05/21/22 0930 05/25/22 1815 05/26/22 0724  AST '14 18 18  '$ ALT '17 22 20  '$ ALKPHOS 34* 37* 28*  BILITOT 0.4 0.6 0.8  PROT 6.7 7.1 6.0*  ALBUMIN 4.2 4.1 3.4*   No results for input(s): "LIPASE", "AMYLASE" in the last 168 hours. No results for input(s): "AMMONIA" in the last 168 hours. Coagulation Profile: No results for input(s): "INR", "PROTIME" in the last 168 hours. Cardiac Enzymes: No results for input(s): "CKTOTAL", "CKMB", "CKMBINDEX", "TROPONINI" in the last 168 hours. BNP (last 3 results) No results for input(s): "PROBNP" in the last 8760 hours. HbA1C: No results for input(s): "HGBA1C" in the last 72 hours. CBG: No results for input(s): "GLUCAP" in the last 168 hours. Lipid Profile: No results for input(s): "CHOL", "HDL", "LDLCALC", "TRIG", "CHOLHDL", "LDLDIRECT" in the last 72 hours. Thyroid Function Tests: No results for input(s): "TSH", "T4TOTAL", "FREET4", "T3FREE", "THYROIDAB" in the last 72 hours. Anemia Panel: No results for input(s): "VITAMINB12", "FOLATE", "FERRITIN", "TIBC", "IRON", "RETICCTPCT" in the last 72 hours. Sepsis Labs: No results for input(s): "PROCALCITON", "LATICACIDVEN" in the last 168 hours.  No results found for this or any previous visit (from the past 240 hour(s)).       Radiology Studies: ECHOCARDIOGRAM COMPLETE  Result Date: 05/27/2022    ECHOCARDIOGRAM REPORT   Patient Name:   Robert Braun Date of Exam: 05/27/2022 Medical Rec #:  [id number]          Height:       76.7 in Accession #:    [id number]         Weight:       186.4 lb Date of Birth:  [birth year]          BSA:          2.165 m Patient Age:    63 years           BP:           101/61 mmHg Patient Gender: M                  HR:           86 bpm. Exam Location:  Inpatient Procedure: 2D Echo, Cardiac Doppler and Color Doppler Indications:    I48.91* Unspeicified atrial fibrillation  History:        Patient has prior history of Echocardiogram examinations, most  recent 10/11/2018. Congenitally corrected transposition of great                 arteries, Arrythmias:Atrial Fibrillation; Risk                 Factors:Hypertension.  Sonographer:    Bernadene Person RDCS Referring Phys: [place]  1. Patiet appears to have congenitally corrected transposition. There is severe dilatation of the systemic (RV) ventricle and systolic function appears to be moderately depressed. The left ventricle has moderately decreased function. The left ventricular internal cavity size was severely dilated. There is severe concentric left ventricular hypertrophy. Left ventricular diastolic function could not be evaluated.  2. Right ventricular systolic function is mildly reduced. The right ventricular size is mildly enlarged. Severely increased right ventricular wall thickness.  3. Left atrial size was severely dilated.  4. The mitral valve is grossly normal. Mild mitral valve regurgitation. No evidence of mitral stenosis.  5. Tricuspid valve regurgitation is mild to moderate.  6. The aortic valve has an indeterminant number of cusps. Aortic valve regurgitation is moderate. No aortic stenosis is present. Conclusion(s)/Recommendation(s): Findings consistent with congenitally corrected transposition. There is severe dilatation of the systemic (RV) ventricle and systolic function appears to be moderately depressed. There is mild  systemic (TV) regurgitations  (TV). There is mild to moderate AR and milld PR. The great vessels run more parralel in nature with the PV maintaining an anterior position. FINDINGS  Left Ventricle: Patiet appears to have congenitally corrected transposition. There is severe dilatation of the systemic (RV) ventricle and systolic function appears to be moderately depressed. The left ventricle has moderately decreased function. The left ventricular internal cavity size was severely dilated. There is severe concentric left ventricular hypertrophy. Left ventricular diastolic function could not be evaluated. Right Ventricle: The right ventricular size is mildly enlarged. Severely increased right ventricular wall thickness. Right ventricular systolic function is mildly reduced. Left Atrium: Left atrial size was severely dilated. Right Atrium: Right atrial size was not well visualized. Pericardium: There is no evidence of pericardial effusion. Mitral Valve: The mitral valve is grossly normal. Mild mitral valve regurgitation. No evidence of mitral valve stenosis. Tricuspid Valve: The tricuspid valve is grossly normal. Tricuspid valve regurgitation is mild to moderate. Aortic Valve: The aortic valve has an indeterminant number of cusps. Aortic valve regurgitation is moderate. Aortic regurgitation PHT measures 552 msec. No aortic stenosis is present. Pulmonic Valve: The pulmonic valve was not well visualized. Pulmonic valve regurgitation is mild. IAS/Shunts: No atrial level shunt detected by color flow Doppler.  LEFT VENTRICLE PLAX 2D LVIDd:         7.20 cm LVIDs:         5.70 cm LV PW:         1.70 cm LV IVS:        1.70 cm LVOT diam:     2.40 cm LV SV:         80 LV SV Index:   37 LVOT Area:     4.52 cm  LV Volumes (MOD) LV vol d, MOD A2C: 153.0 ml LV vol d, MOD A4C: 118.0 ml LV vol s, MOD A2C: 73.0 ml LV vol s, MOD A4C: 73.2 ml LV SV MOD A2C:     80.0 ml LV SV MOD A4C:     118.0 ml LV SV MOD BP:      63.0 ml RIGHT VENTRICLE  RV S prime:     13.70 cm/s TAPSE (M-mode):  1.6 cm LEFT ATRIUM              Index        RIGHT ATRIUM           Index LA diam:        5.30 cm  2.45 cm/m   RA Area:     14.40 cm LA Vol (A2C):   92.6 ml  42.77 ml/m  RA Volume:   33.40 ml  15.43 ml/m LA Vol (A4C):   108.0 ml 49.88 ml/m LA Biplane Vol: 104.0 ml 48.03 ml/m  AORTIC VALVE LVOT Vmax:         97.93 cm/s LVOT Vmean:        70.433 cm/s LVOT VTI:          0.177 m AI PHT:            552 msec AR Vena Contracta: 0.40 cm  AORTA Ao Root diam: 3.70 cm Ao Asc diam:  3.70 cm  SHUNTS Systemic VTI:  0.18 m Systemic Diam: 2.40 cm Kardie Tobb DO Electronically signed by Berniece Salines DO Signature Date/Time: 05/27/2022/1:39:32 PM    Final    US THYROID  Result Date: 05/26/2022 CLINICAL DATA:  Hyperthyroidism EXAM: THYROID ULTRASOUND TECHNIQUE: Ultrasound examination of the thyroid gland and adjacent soft tissues was performed. COMPARISON:  None available FINDINGS: Parenchymal Echotexture: Mildly heterogenous Isthmus: 0.5 cm Right lobe: 6 x 3 x 2.3 x 2.3 cm Left lobe: 6.1 x 1.9 x 2.7 cm _________________________________________________________ Estimated total number of nodules >/= 1 cm: 0 Number of spongiform nodules >/=  2 cm not described below (TR1): 0 Number of mixed cystic and solid nodules >/= 1.5 cm not described below (TR2): 0 _________________________________________________________ No discrete nodules are seen within the thyroid gland. IMPRESSION: Mild diffuse heterogeneous thyroid parenchyma without discrete nodule. The above is in keeping with the ACR TI-RADS recommendations - J Am Coll Radiol 2017;14:587-595. Electronically Signed   By: Miachel Roux M.D.   On: 05/26/2022 08:59   DG Chest 1 View  Result Date: 05/25/2022 CLINICAL DATA:  Palpitations. EXAM: CHEST  1 VIEW COMPARISON:  None Available. FINDINGS: The heart size and mediastinal contours are within normal limits. Both lungs are clear. The visualized skeletal structures are unremarkable.  IMPRESSION: No active disease. Electronically Signed   By: Virgina Norfolk M.D.   On: 05/25/2022 18:55        Scheduled Meds:  bisoprolol  15 mg Oral Daily   lisinopril  40 mg Oral Daily   methimazole  10 mg Oral Daily   rivaroxaban  20 mg Oral Daily   Continuous Infusions:   LOS: 1 day    Time spent: 37 min  Georgette Shell, MD 05/27/2022, 3:26 PM

## 2022-05-28 DIAGNOSIS — I4891 Unspecified atrial fibrillation: Secondary | ICD-10-CM | POA: Diagnosis not present

## 2022-05-28 LAB — BASIC METABOLIC PANEL
Anion gap: 7 (ref 5–15)
BUN: 12 mg/dL (ref 6–20)
CO2: 28 mmol/L (ref 22–32)
Calcium: 9.1 mg/dL (ref 8.9–10.3)
Chloride: 101 mmol/L (ref 98–111)
Creatinine, Ser: 0.81 mg/dL (ref 0.61–1.24)
GFR, Estimated: >60 mL/min (ref >60)
Glucose, Bld: 108 mg/dL — ABNORMAL HIGH (ref 70–99)
Potassium: 4.3 mmol/L (ref 3.5–5.1)
Sodium: 136 mmol/L (ref 135–145)

## 2022-05-28 LAB — MAGNESIUM: Magnesium: 2.1 mg/dL (ref 1.7–2.4)

## 2022-05-28 MED ORDER — BISOPROLOL FUMARATE 5 MG PO TABS
10.0000 mg | ORAL_TABLET | Freq: Two times a day (BID) | ORAL | Status: DC
  Administered 2022-05-28 – 2022-05-30 (×4): 10 mg via ORAL
  Filled 2022-05-28 (×4): qty 2

## 2022-05-28 MED ORDER — PREDNISONE 20 MG PO TABS
30.0000 mg | ORAL_TABLET | Freq: Every day | ORAL | Status: DC
  Administered 2022-05-29 – 2022-05-30 (×2): 30 mg via ORAL
  Filled 2022-05-28 (×2): qty 1

## 2022-05-28 NOTE — Progress Notes (Addendum)
Progress Note  Patient Name: Robert Braun Date of Encounter: 05/28/2022  [facility] Cardiologist: None   Subjective   Had a rough night, HRs were elevated. Felt his heart racing.   Inpatient Medications    Scheduled Meds:  bisoprolol  10 mg Oral BID   methimazole  10 mg Oral Daily   [START ON 05/29/2022] predniSONE  30 mg Oral Q breakfast   rivaroxaban  20 mg Oral Daily   Continuous Infusions:  PRN Meds:    Vital Signs    Vitals:   05/28/22 0000 05/28/22 0610 05/28/22 0620 05/28/22 1018  BP: 101/61 106/64  108/77  Pulse: (!) 117 (!) 108 (!) 127 95  Resp:    19  Temp: 97.8 F (36.6 C) 98 F (36.7 C)  97.9 F (36.6 C)  TempSrc:  Oral  Oral  SpO2:  100%  99%  Weight:      Height:        Intake/Output Summary (Last 24 hours) at 05/28/2022 1054 Last data filed at 05/28/2022 0820 Gross per 24 hour  Intake 1196 ml  Output 0 ml  Net 1196 ml      05/27/2022    4:35 AM 05/26/2022    2:31 AM 05/19/2022   10:23 AM  Last 3 Weights  Weight (lbs) 186 lb 6.4 oz 189 lb 4.8 oz 193 lb 9.6 oz  Weight (kg) 84.55 kg 85.866 kg 87.816 kg      Telemetry    Afib RVR, rates 90-140s - Personally Reviewed  ECG    No new tracing  Physical Exam   GEN: No acute distress.   Neck: No JVD Cardiac: Irreg Irreg, no murmurs, rubs, or gallops.  Respiratory: Clear to auscultation bilaterally. GI: Soft, nontender, non-distended  MS: No edema; No deformity. Neuro:  Nonfocal  Psych: Normal affect   Labs    High Sensitivity Troponin:   Recent Labs  Lab 05/25/22 1815 05/25/22 2007  TROPONINIHS 14 27*     Chemistry Recent Labs  Lab 05/25/22 1815 05/26/22 0724 05/28/22 0425  NA 131* 138 136  K 4.1 3.7 4.3  CL 98 103 101  CO2 '25 28 28  '$ GLUCOSE 116* 108* 108*  BUN '14 8 12  '$ CREATININE 0.78 0.69 0.81  CALCIUM 9.3 9.0 9.1  MG  --   --  2.1  PROT 7.1 6.0*  --   ALBUMIN 4.1 3.4*  --   AST 18 18  --   ALT 22 20  --   ALKPHOS 37* 28*  --   BILITOT 0.6 0.8  --    GFRNONAA >60 >60 >60  ANIONGAP '8 7 7    '$ Lipids No results for input(s): "CHOL", "TRIG", "HDL", "LABVLDL", "LDLCALC", "CHOLHDL" in the last 168 hours.  Hematology Recent Labs  Lab 05/25/22 1815 05/26/22 0724  WBC 8.2 6.3  RBC 4.02* 3.84*  HGB 12.5* 11.9*  HCT 36.4* 34.6*  MCV 90.5 90.1  MCH 31.1 31.0  MCHC 34.3 34.4  RDW 11.3* 11.4*  PLT 275 269   Thyroid  Recent Labs  Lab 05/22/22 1520  FREET4 3.46*    BNPNo results for input(s): "BNP", "PROBNP" in the last 168 hours.  DDimer No results for input(s): "DDIMER" in the last 168 hours.   Radiology    ECHOCARDIOGRAM COMPLETE  Result Date: 05/27/2022    ECHOCARDIOGRAM REPORT   Patient Name:   Robert Braun Date of Exam: 05/27/2022 Medical Rec #:  [id number]  Height:       76.7 in Accession #:    [id number]         Weight:       186.4 lb Date of Birth:  [birth year]          BSA:          2.165 m Patient Age:    56 years           BP:           101/61 mmHg Patient Gender: M                  HR:           86 bpm. Exam Location:  Inpatient Procedure: 2D Echo, Cardiac Doppler and Color Doppler Indications:    I48.91* Unspeicified atrial fibrillation  History:        Patient has prior history of Echocardiogram examinations, most                 recent 10/11/2018. Congenitally corrected transposition of great                 arteries, Arrythmias:Atrial Fibrillation; Risk                 Factors:Hypertension.  Sonographer:    Bernadene Person RDCS Referring Phys: [place]  1. Patiet appears to have congenitally corrected transposition. There is severe dilatation of the systemic (RV) ventricle and systolic function appears to be moderately depressed. The left ventricle has moderately decreased function. The left ventricular internal cavity size was severely dilated. There is severe concentric left ventricular hypertrophy. Left ventricular diastolic function could not be evaluated.  2. Right ventricular systolic  function is mildly reduced. The right ventricular size is mildly enlarged. Severely increased right ventricular wall thickness.  3. Left atrial size was severely dilated.  4. The mitral valve is grossly normal. Mild mitral valve regurgitation. No evidence of mitral stenosis.  5. Tricuspid valve regurgitation is mild to moderate.  6. The aortic valve has an indeterminant number of cusps. Aortic valve regurgitation is moderate. No aortic stenosis is present. Conclusion(s)/Recommendation(s): Findings consistent with congenitally corrected transposition. There is severe dilatation of the systemic (RV) ventricle and systolic function appears to be moderately depressed. There is mild systemic (TV) regurgitations  (TV). There is mild to moderate AR and milld PR. The great vessels run more parralel in nature with the PV maintaining an anterior position. FINDINGS  Left Ventricle: Patiet appears to have congenitally corrected transposition. There is severe dilatation of the systemic (RV) ventricle and systolic function appears to be moderately depressed. The left ventricle has moderately decreased function. The left ventricular internal cavity size was severely dilated. There is severe concentric left ventricular hypertrophy. Left ventricular diastolic function could not be evaluated. Right Ventricle: The right ventricular size is mildly enlarged. Severely increased right ventricular wall thickness. Right ventricular systolic function is mildly reduced. Left Atrium: Left atrial size was severely dilated. Right Atrium: Right atrial size was not well visualized. Pericardium: There is no evidence of pericardial effusion. Mitral Valve: The mitral valve is grossly normal. Mild mitral valve regurgitation. No evidence of mitral valve stenosis. Tricuspid Valve: The tricuspid valve is grossly normal. Tricuspid valve regurgitation is mild to moderate. Aortic Valve: The aortic valve has an indeterminant number of cusps. Aortic valve  regurgitation is moderate. Aortic regurgitation PHT measures 552 msec. No aortic stenosis is present. Pulmonic Valve: The pulmonic valve was not well visualized.  Pulmonic valve regurgitation is mild. IAS/Shunts: No atrial level shunt detected by color flow Doppler.  LEFT VENTRICLE PLAX 2D LVIDd:         7.20 cm LVIDs:         5.70 cm LV PW:         1.70 cm LV IVS:        1.70 cm LVOT diam:     2.40 cm LV SV:         80 LV SV Index:   37 LVOT Area:     4.52 cm  LV Volumes (MOD) LV vol d, MOD A2C: 153.0 ml LV vol d, MOD A4C: 118.0 ml LV vol s, MOD A2C: 73.0 ml LV vol s, MOD A4C: 73.2 ml LV SV MOD A2C:     80.0 ml LV SV MOD A4C:     118.0 ml LV SV MOD BP:      63.0 ml RIGHT VENTRICLE RV S prime:     13.70 cm/s TAPSE (M-mode): 1.6 cm LEFT ATRIUM              Index        RIGHT ATRIUM           Index LA diam:        5.30 cm  2.45 cm/m   RA Area:     14.40 cm LA Vol (A2C):   92.6 ml  42.77 ml/m  RA Volume:   33.40 ml  15.43 ml/m LA Vol (A4C):   108.0 ml 49.88 ml/m LA Biplane Vol: 104.0 ml 48.03 ml/m  AORTIC VALVE LVOT Vmax:         97.93 cm/s LVOT Vmean:        70.433 cm/s LVOT VTI:          0.177 m AI PHT:            552 msec AR Vena Contracta: 0.40 cm  AORTA Ao Root diam: 3.70 cm Ao Asc diam:  3.70 cm  SHUNTS Systemic VTI:  0.18 m Systemic Diam: 2.40 cm Kardie Tobb DO Electronically signed by Berniece Salines DO Signature Date/Time: 05/27/2022/1:39:32 PM    Final     Cardiac Studies   Echo: 05/27/22  IMPRESSIONS     1. Patiet appears to have congenitally corrected transposition. There is  severe dilatation of the systemic (RV) ventricle and systolic function  appears to be moderately depressed. The left ventricle has moderately  decreased function. The left  ventricular internal cavity size was severely dilated. There is severe  concentric left ventricular hypertrophy. Left ventricular diastolic  function could not be evaluated.   2. Right ventricular systolic function is mildly reduced. The right   ventricular size is mildly enlarged. Severely increased right ventricular  wall thickness.   3. Left atrial size was severely dilated.   4. The mitral valve is grossly normal. Mild mitral valve regurgitation.  No evidence of mitral stenosis.   5. Tricuspid valve regurgitation is mild to moderate.   6. The aortic valve has an indeterminant number of cusps. Aortic valve  regurgitation is moderate. No aortic stenosis is present.   Conclusion(s)/Recommendation(s): Findings consistent with congenitally  corrected transposition. There is severe dilatation of the systemic (RV)  ventricle and systolic function appears to be moderately depressed. There  is mild systemic (TV) regurgitations   (TV). There is mild to moderate AR and milld PR. The great vessels run  more parralel in nature with the PV maintaining an anterior position.  FINDINGS   Left Ventricle: Patiet appears to have congenitally corrected  transposition. There is severe dilatation of the systemic (RV) ventricle  and systolic function appears to be moderately depressed. The left  ventricle has moderately decreased function. The  left ventricular internal cavity size was severely dilated. There is  severe concentric left ventricular hypertrophy. Left ventricular diastolic  function could not be evaluated.   Right Ventricle: The right ventricular size is mildly enlarged. Severely  increased right ventricular wall thickness. Right ventricular systolic  function is mildly reduced.   Left Atrium: Left atrial size was severely dilated.   Right Atrium: Right atrial size was not well visualized.   Pericardium: There is no evidence of pericardial effusion.   Mitral Valve: The mitral valve is grossly normal. Mild mitral valve  regurgitation. No evidence of mitral valve stenosis.   Tricuspid Valve: The tricuspid valve is grossly normal. Tricuspid valve  regurgitation is mild to moderate.   Aortic Valve: The aortic valve has an  indeterminant number of cusps.  Aortic valve regurgitation is moderate. Aortic regurgitation PHT measures  552 msec. No aortic stenosis is present.   Pulmonic Valve: The pulmonic valve was not well visualized. Pulmonic valve  regurgitation is mild.   IAS/Shunts: No atrial level shunt detected by color flow Doppler.       Patient Profile     56 y.o. 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  Assessment & Plan    Paroxysmal Afib with RVR: Last cardiac MRI was 10/23/2020, his right atrium was normal in size, left atrium was severely enlarged, RV EF & LV EF normal -- previously on amiodarone over the last 3 years, now presenting with breakthrough Afib and new hyperthyroidism -- bisoprolol increased to '15mg'$  daily and Dilt stopped yesterday. HRs increased. Will increase bisoprolol to '10mg'$  BID. Stop lisinopril to allow for more BP room. If HR remains poorly controlled, question whether attempt at cardioversion should be made? 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 Afib. Will increase bisoprolol to 10 mg PO BID. Hopefully can be [facility] tomorrow if HR  controlled. Pt as an OP appt at [facility] 8/15.   Lorretta Harp, M.D., [place], [facility], [place] [address] [place], [place]  [address]  [phone] 05/28/2022 2:08 PM

## 2022-05-28 NOTE — Progress Notes (Signed)
TR[facility] night cross cover note:   I was notified by RN of the patient's request for resumption of home dosing of bisoprolol.  Specifically, patient on bisoprolol 10 mg p.o. every morning as well as 5 mg nightly as an outpatient. This is relative to existing order for bisoprolol 15 mg p.o. daily.   Will attempt to follow-up on cards formal recs, and in the meantime of I have resumed home dosing of bisoprolol, as requested by patient, who has had opportunity to speak directly with cards.     Babs Bertin, DO Hospitalist

## 2022-05-28 NOTE — Progress Notes (Signed)
PROGRESS NOTE    Robert Braun  JSH:[id number] DOB: 1966-09-03 DOA: 05/25/2022 PCP: Tammi Sou, MD  Brief Narrative:Robert Braun is a 56 y.o. 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  Denies any chest pain or shortness of breath.  Patient recently had followed up with his primary care physician and blood work showed features concerning for hyperthyroidism.   Patient states last month he was in [place] when he had an episode of A-fib with RVR when he had gone to the ER over there and his Bystolic dose was increased.  He converted to normal sinus rhythm without any intervention.  Takes Xarelto daily in the morning.   ED Course: In the ER patient is found to be in A-fib with RVR.  Patient declined cardioversion since patient recently had A-fib with RVR in [place] and had reverted back to normal sinus without any intervention.  Thyroid function test showed features concerning for hyperthyroidism.  He was started on a Cardizem drip and bisoprolol was continued.  Assessment & Plan:   Principal Problem:   Atrial fibrillation with RVR (HCC) Active Problems:   Adult congenital heart disease   Hyperthyroidism  #1 paroxysmal afib with RVR -patient was maintained on amiodarone for many years.  Patient came in with A-fib RVR on amiodarone and new findings of hyperthyroidism.  Amiodarone was stopped and bisoprolol dose is being increased today to 10 mg twice daily to max out.  Diltiazem stopped 05/27/2022.  His heart rate was elevated overnight and he felt palpitations.  Cardiology stopping lisinopril to allow room for bisoprolol.  Remains on Xarelto.   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 Echo 8/9-Patiet appears to have congenitally corrected transposition. There is severe dilatation of the systemic (RV) ventricle and  systolic function  appears to be moderately depressed. The left ventricle has moderately  decreased function. The left  ventricular internal cavity size was severely dilated. There is severe  concentric left ventricular hypertrophy. Left ventricular diastolic  function could not be evaluated.   Right ventricular systolic function is mildly reduced. The right  ventricular size is mildly enlarged. Severely increased right ventricular wall thickness.   Left atrial size was severely dilated.  The mitral valve is grossly normal. Mild mitral valve regurgitation.  No evidence of mitral stenosis. Tricuspid valve regurgitation is mild to moderate. The aortic valve has an indeterminant number of cusps. Aortic valve regurgitation is moderate. No aortic stenosis is present.   #2 hyperthyroidsm started methimazole 10 mg daily Likely from amiodarone  Thyrotropin receptor antibody is less than 1.10 normal TPO antibodies less than 9 normal Thyroid ultrasound mild diffuse heterogeneous thyroid parenchyma without discrete nodule. Will start prednisone 30 mg daily and taper to dc over the next 2 weeks.  #3 anemia chronic stable   Estimated body mass index is 22.25 kg/m as calculated from the following:   Height as of this encounter: 6' 4.75" (1.949 m).   Weight as of this encounter: 84.6 kg.  DVT prophylaxis: Xarelto  code Status: Full code  family Communication: None at bedside  disposition Plan:  Status is: Inpatient Remains inpatient appropriate because: A-fib rapid   Consultants:  Cardiology  Procedures: None  antimicrobials: None  Subjective:  Had a rough nite  Had severe palpitations Objective: Vitals:   05/28/22 0000 05/28/22 0610 05/28/22 0620 05/28/22 1018  BP: 101/61 106/64  108/77  Pulse: (!) 117 (!) 108 (!) 127 95  Resp:    19  Temp: 97.8 F (36.6 C) 98 F (36.7 C)  97.9 F (36.6 C)  TempSrc:  Oral  Oral  SpO2:  100%  99%  Weight:      Height:        Intake/Output  Summary (Last 24 hours) at 05/28/2022 1318 Last data filed at 05/28/2022 1234 Gross per 24 hour  Intake 1140 ml  Output 0 ml  Net 1140 ml    Filed Weights   05/26/22 0231 05/27/22 0435  Weight: 85.9 kg 84.6 kg    Examination:  General exam: Appears anxious  Respiratory system: Clear to auscultation. Respiratory effort normal. Cardiovascular system: S1 & S2 heard, irreg  No JVD, murmurs, rubs, gallops or clicks. No pedal edema. Gastrointestinal system: Abdomen is nondistended, soft and nontender. No organomegaly or masses felt. Normal bowel sounds heard. Central nervous system: Alert and oriented. No focal neurological deficits. Extremities: Symmetric 5 x 5 power. Skin: No rashes, lesions or ulcers Psychiatry: Judgement and insight appear normal. Mood & affect appropriate.     Data Reviewed: I have personally reviewed following labs and imaging studies  CBC: Recent Labs  Lab 05/25/22 1815 05/26/22 0724  WBC 8.2 6.3  HGB 12.5* 11.9*  HCT 36.4* 34.6*  MCV 90.5 90.1  PLT 275 481    Basic Metabolic Panel: Recent Labs  Lab 05/25/22 1815 05/26/22 0724 05/28/22 0425  NA 131* 138 136  K 4.1 3.7 4.3  CL 98 103 101  CO2 '25 28 28  '$ GLUCOSE 116* 108* 108*  BUN '14 8 12  '$ CREATININE 0.78 0.69 0.81  CALCIUM 9.3 9.0 9.1  MG  --   --  2.1    GFR: Estimated Creatinine Clearance: 123.3 mL/min (by C-G formula based on SCr of 0.81 mg/dL). Liver Function Tests: Recent Labs  Lab 05/25/22 1815 05/26/22 0724  AST 18 18  ALT 22 20  ALKPHOS 37* 28*  BILITOT 0.6 0.8  PROT 7.1 6.0*  ALBUMIN 4.1 3.4*    No results for input(s): "LIPASE", "AMYLASE" in the last 168 hours. No results for input(s): "AMMONIA" in the last 168 hours. Coagulation Profile: No results for input(s): "INR", "PROTIME" in the last 168 hours. Cardiac Enzymes: No results for input(s): "CKTOTAL", "CKMB", "CKMBINDEX", "TROPONINI" in the last 168 hours. BNP (last 3 results) No results for input(s): "PROBNP"  in the last 8760 hours. HbA1C: No results for input(s): "HGBA1C" in the last 72 hours. CBG: No results for input(s): "GLUCAP" in the last 168 hours. Lipid Profile: No results for input(s): "CHOL", "HDL", "LDLCALC", "TRIG", "CHOLHDL", "LDLDIRECT" in the last 72 hours. Thyroid Function Tests: No results for input(s): "TSH", "T4TOTAL", "FREET4", "T3FREE", "THYROIDAB" in the last 72 hours. Anemia Panel: No results for input(s): "VITAMINB12", "FOLATE", "FERRITIN", "TIBC", "IRON", "RETICCTPCT" in the last 72 hours. Sepsis Labs: No results for input(s): "PROCALCITON", "LATICACIDVEN" in the last 168 hours.  No results found for this or any previous visit (from the past 240 hour(s)).       Radiology Studies: ECHOCARDIOGRAM COMPLETE  Result Date: 05/27/2022    ECHOCARDIOGRAM REPORT   Patient Name:   LEOPOLDO MAZZIE Date of Exam: 05/27/2022 Medical Rec #:  [id number]          Height:       76.7 in Accession #:    [id number]         Weight:       186.4 lb Date of  Birth:  1966-04-19          BSA:          2.165 m Patient Age:    47 years           BP:           101/61 mmHg Patient Gender: M                  HR:           86 bpm. Exam Location:  Inpatient Procedure: 2D Echo, Cardiac Doppler and Color Doppler Indications:    I48.91* Unspeicified atrial fibrillation  History:        Patient has prior history of Echocardiogram examinations, most                 recent 10/11/2018. Congenitally corrected transposition of great                 arteries, Arrythmias:Atrial Fibrillation; Risk                 Factors:Hypertension.  Sonographer:    Bernadene Person RDCS Referring Phys: [place]  1. Patiet appears to have congenitally corrected transposition. There is severe dilatation of the systemic (RV) ventricle and systolic function appears to be moderately depressed. The left ventricle has moderately decreased function. The left ventricular internal cavity size was severely dilated. There  is severe concentric left ventricular hypertrophy. Left ventricular diastolic function could not be evaluated.  2. Right ventricular systolic function is mildly reduced. The right ventricular size is mildly enlarged. Severely increased right ventricular wall thickness.  3. Left atrial size was severely dilated.  4. The mitral valve is grossly normal. Mild mitral valve regurgitation. No evidence of mitral stenosis.  5. Tricuspid valve regurgitation is mild to moderate.  6. The aortic valve has an indeterminant number of cusps. Aortic valve regurgitation is moderate. No aortic stenosis is present. Conclusion(s)/Recommendation(s): Findings consistent with congenitally corrected transposition. There is severe dilatation of the systemic (RV) ventricle and systolic function appears to be moderately depressed. There is mild systemic (TV) regurgitations  (TV). There is mild to moderate AR and milld PR. The great vessels run more parralel in nature with the PV maintaining an anterior position. FINDINGS  Left Ventricle: Patiet appears to have congenitally corrected transposition. There is severe dilatation of the systemic (RV) ventricle and systolic function appears to be moderately depressed. The left ventricle has moderately decreased function. The left ventricular internal cavity size was severely dilated. There is severe concentric left ventricular hypertrophy. Left ventricular diastolic function could not be evaluated. Right Ventricle: The right ventricular size is mildly enlarged. Severely increased right ventricular wall thickness. Right ventricular systolic function is mildly reduced. Left Atrium: Left atrial size was severely dilated. Right Atrium: Right atrial size was not well visualized. Pericardium: There is no evidence of pericardial effusion. Mitral Valve: The mitral valve is grossly normal. Mild mitral valve regurgitation. No evidence of mitral valve stenosis. Tricuspid Valve: The tricuspid valve is grossly  normal. Tricuspid valve regurgitation is mild to moderate. Aortic Valve: The aortic valve has an indeterminant number of cusps. Aortic valve regurgitation is moderate. Aortic regurgitation PHT measures 552 msec. No aortic stenosis is present. Pulmonic Valve: The pulmonic valve was not well visualized. Pulmonic valve regurgitation is mild. IAS/Shunts: No atrial level shunt detected by color flow Doppler.  LEFT VENTRICLE PLAX 2D LVIDd:         7.20 cm LVIDs:  5.70 cm LV PW:         1.70 cm LV IVS:        1.70 cm LVOT diam:     2.40 cm LV SV:         80 LV SV Index:   37 LVOT Area:     4.52 cm  LV Volumes (MOD) LV vol d, MOD A2C: 153.0 ml LV vol d, MOD A4C: 118.0 ml LV vol s, MOD A2C: 73.0 ml LV vol s, MOD A4C: 73.2 ml LV SV MOD A2C:     80.0 ml LV SV MOD A4C:     118.0 ml LV SV MOD BP:      63.0 ml RIGHT VENTRICLE RV S prime:     13.70 cm/s TAPSE (M-mode): 1.6 cm LEFT ATRIUM              Index        RIGHT ATRIUM           Index LA diam:        5.30 cm  2.45 cm/m   RA Area:     14.40 cm LA Vol (A2C):   92.6 ml  42.77 ml/m  RA Volume:   33.40 ml  15.43 ml/m LA Vol (A4C):   108.0 ml 49.88 ml/m LA Biplane Vol: 104.0 ml 48.03 ml/m  AORTIC VALVE LVOT Vmax:         97.93 cm/s LVOT Vmean:        70.433 cm/s LVOT VTI:          0.177 m AI PHT:            552 msec AR Vena Contracta: 0.40 cm  AORTA Ao Root diam: 3.70 cm Ao Asc diam:  3.70 cm  SHUNTS Systemic VTI:  0.18 m Systemic Diam: 2.40 cm Kardie Tobb DO Electronically signed by Berniece Salines DO Signature Date/Time: 05/27/2022/1:39:32 PM    Final         Scheduled Meds:  bisoprolol  10 mg Oral BID   methimazole  10 mg Oral Daily   [START ON 05/29/2022] predniSONE  30 mg Oral Q breakfast   rivaroxaban  20 mg Oral Daily   Continuous Infusions:   LOS: 2 days    Time spent: 35 min  Georgette Shell, MD 05/28/2022, 1:18 PM

## 2022-05-28 NOTE — Progress Notes (Signed)
Mobility Specialist Progress Note:   05/28/22 1030  Mobility  Activity Ambulated independently in hallway  Level of Assistance Independent  Assistive Device None  Distance Ambulated (ft) 300 ft  Activity Response Tolerated well  $Mobility charge 1 Mobility   Pt ambulating 3x a Tadarius Maland independently. No complaints and all needs met.   [facility] Yolanda Huffstetler Mobility Specialist

## 2022-05-28 NOTE — TOC Progression Note (Signed)
Transition of Care [facility]) - Progression Note    Patient Details  Name: KEYANDRE PILEGGI MRN: [id number] Date of Birth: 05-01-1966  Transition of Care [facility] CM/SW Contact  Zenon Mayo, RN Phone Number: 05/28/2022, 4:16 PM  Clinical Narrative:    Mickel Crow, indep, afib with RVR, started on tapozale for thyroid , now on vesoprolol for afib.  Cards to see for med adjustments.  TOC following.         Expected Discharge Plan and Services                                                 Social Determinants of Health (SDOH) Interventions    Readmission Risk Interventions     No data to display

## 2022-05-29 DIAGNOSIS — I4891 Unspecified atrial fibrillation: Secondary | ICD-10-CM | POA: Diagnosis not present

## 2022-05-29 LAB — MAGNESIUM: Magnesium: 2 mg/dL (ref 1.7–2.4)

## 2022-05-29 LAB — BASIC METABOLIC PANEL
Anion gap: 6 (ref 5–15)
BUN: 12 mg/dL (ref 6–20)
CO2: 28 mmol/L (ref 22–32)
Calcium: 9.1 mg/dL (ref 8.9–10.3)
Chloride: 100 mmol/L (ref 98–111)
Creatinine, Ser: 0.79 mg/dL (ref 0.61–1.24)
GFR, Estimated: >60 mL/min (ref >60)
Glucose, Bld: 115 mg/dL — ABNORMAL HIGH (ref 70–99)
Potassium: 4.1 mmol/L (ref 3.5–5.1)
Sodium: 134 mmol/L — ABNORMAL LOW (ref 135–145)

## 2022-05-29 MED ORDER — DIGOXIN 0.25 MG/ML IJ SOLN: 0.2500 mg | Freq: Once | INTRAMUSCULAR | Status: DC

## 2022-05-29 MED ORDER — DIGOXIN 125 MCG PO TABS: 0.2500 mg | ORAL_TABLET | Freq: Every day | ORAL | Status: DC

## 2022-05-29 NOTE — Progress Notes (Addendum)
Progress Note  Patient Name: Robert Braun Date of Encounter: 05/29/2022  [facility] Cardiologist: None   Subjective   Felt better last evening, but rates increased again this morning.   Inpatient Medications    Scheduled Meds:  bisoprolol  10 mg Oral BID   methimazole  10 mg Oral Daily   predniSONE  30 mg Oral Q breakfast   rivaroxaban  20 mg Oral Daily   Continuous Infusions:  PRN Meds:    Vital Signs    Vitals:   05/29/22 0330 05/29/22 0345 05/29/22 0540 05/29/22 0739  BP:   105/71 107/67  Pulse: 93 (!) 102 70 82  Resp:    18  Temp:    98.3 F (36.8 C)  TempSrc:    Oral  SpO2:   99% 100%  Weight:      Height:        Intake/Output Summary (Last 24 hours) at 05/29/2022 0850 Last data filed at 05/28/2022 2300 Gross per 24 hour  Intake 900 ml  Output --  Net 900 ml      05/27/2022    4:35 AM 05/26/2022    2:31 AM 05/19/2022   10:23 AM  Last 3 Weights  Weight (lbs) 186 lb 6.4 oz 189 lb 4.8 oz 193 lb 9.6 oz  Weight (kg) 84.55 kg 85.866 kg 87.816 kg      Telemetry    Afib rates 100-120s - Personally Reviewed  ECG    No new tracing  Physical Exam   GEN: No acute distress.   Neck: No JVD Cardiac: Irreg Irreg, no murmurs, rubs, or gallops.  Respiratory: Clear to auscultation bilaterally. GI: Soft, nontender, non-distended  MS: No edema; No deformity. Neuro:  Nonfocal  Psych: Normal affect   Labs    High Sensitivity Troponin:   Recent Labs  Lab 05/25/22 1815 05/25/22 2007  TROPONINIHS 14 27*     Chemistry Recent Labs  Lab 05/25/22 1815 05/26/22 0724 05/28/22 0425  NA 131* 138 136  K 4.1 3.7 4.3  CL 98 103 101  CO2 '25 28 28  '$ GLUCOSE 116* 108* 108*  BUN '14 8 12  '$ CREATININE 0.78 0.69 0.81  CALCIUM 9.3 9.0 9.1  MG  --   --  2.1  PROT 7.1 6.0*  --   ALBUMIN 4.1 3.4*  --   AST 18 18  --   ALT 22 20  --   ALKPHOS 37* 28*  --   BILITOT 0.6 0.8  --   GFRNONAA >60 >60 >60  ANIONGAP '8 7 7    '$ Lipids No results for input(s):  "CHOL", "TRIG", "HDL", "LABVLDL", "LDLCALC", "CHOLHDL" in the last 168 hours.  Hematology Recent Labs  Lab 05/25/22 1815 05/26/22 0724  WBC 8.2 6.3  RBC 4.02* 3.84*  HGB 12.5* 11.9*  HCT 36.4* 34.6*  MCV 90.5 90.1  MCH 31.1 31.0  MCHC 34.3 34.4  RDW 11.3* 11.4*  PLT 275 269   Thyroid  Recent Labs  Lab 05/22/22 1520  FREET4 3.46*    BNPNo results for input(s): "BNP", "PROBNP" in the last 168 hours.  DDimer No results for input(s): "DDIMER" in the last 168 hours.   Radiology    ECHOCARDIOGRAM COMPLETE  Result Date: 05/27/2022    ECHOCARDIOGRAM REPORT   Patient Name:   Robert Braun Date of Exam: 05/27/2022 Medical Rec #:  [id number]          Height:       76.7 in Accession #:  [id number]         Weight:       186.4 lb Date of Birth:  10/25/1965          BSA:          2.165 m Patient Age:    56 years           BP:           101/61 mmHg Patient Gender: M                  HR:           86 bpm. Exam Location:  Inpatient Procedure: 2D Echo, Cardiac Doppler and Color Doppler Indications:    I48.91* Unspeicified atrial fibrillation  History:        Patient has prior history of Echocardiogram examinations, most                 recent 10/11/2018. Congenitally corrected transposition of great                 arteries, Arrythmias:Atrial Fibrillation; Risk                 Factors:Hypertension.  Sonographer:    Bernadene Person RDCS Referring Phys: [place]  1. Patiet appears to have congenitally corrected transposition. There is severe dilatation of the systemic (RV) ventricle and systolic function appears to be moderately depressed. The left ventricle has moderately decreased function. The left ventricular internal cavity size was severely dilated. There is severe concentric left ventricular hypertrophy. Left ventricular diastolic function could not be evaluated.  2. Right ventricular systolic function is mildly reduced. The right ventricular size is mildly enlarged.  Severely increased right ventricular wall thickness.  3. Left atrial size was severely dilated.  4. The mitral valve is grossly normal. Mild mitral valve regurgitation. No evidence of mitral stenosis.  5. Tricuspid valve regurgitation is mild to moderate.  6. The aortic valve has an indeterminant number of cusps. Aortic valve regurgitation is moderate. No aortic stenosis is present. Conclusion(s)/Recommendation(s): Findings consistent with congenitally corrected transposition. There is severe dilatation of the systemic (RV) ventricle and systolic function appears to be moderately depressed. There is mild systemic (TV) regurgitations  (TV). There is mild to moderate AR and milld PR. The great vessels run more parralel in nature with the PV maintaining an anterior position. FINDINGS  Left Ventricle: Patiet appears to have congenitally corrected transposition. There is severe dilatation of the systemic (RV) ventricle and systolic function appears to be moderately depressed. The left ventricle has moderately decreased function. The left ventricular internal cavity size was severely dilated. There is severe concentric left ventricular hypertrophy. Left ventricular diastolic function could not be evaluated. Right Ventricle: The right ventricular size is mildly enlarged. Severely increased right ventricular wall thickness. Right ventricular systolic function is mildly reduced. Left Atrium: Left atrial size was severely dilated. Right Atrium: Right atrial size was not well visualized. Pericardium: There is no evidence of pericardial effusion. Mitral Valve: The mitral valve is grossly normal. Mild mitral valve regurgitation. No evidence of mitral valve stenosis. Tricuspid Valve: The tricuspid valve is grossly normal. Tricuspid valve regurgitation is mild to moderate. Aortic Valve: The aortic valve has an indeterminant number of cusps. Aortic valve regurgitation is moderate. Aortic regurgitation PHT measures 552 msec. No  aortic stenosis is present. Pulmonic Valve: The pulmonic valve was not well visualized. Pulmonic valve regurgitation is mild. IAS/Shunts: No atrial level shunt detected by color flow  Doppler.  LEFT VENTRICLE PLAX 2D LVIDd:         7.20 cm LVIDs:         5.70 cm LV PW:         1.70 cm LV IVS:        1.70 cm LVOT diam:     2.40 cm LV SV:         80 LV SV Index:   37 LVOT Area:     4.52 cm  LV Volumes (MOD) LV vol d, MOD A2C: 153.0 ml LV vol d, MOD A4C: 118.0 ml LV vol s, MOD A2C: 73.0 ml LV vol s, MOD A4C: 73.2 ml LV SV MOD A2C:     80.0 ml LV SV MOD A4C:     118.0 ml LV SV MOD BP:      63.0 ml RIGHT VENTRICLE RV S prime:     13.70 cm/s TAPSE (M-mode): 1.6 cm LEFT ATRIUM              Index        RIGHT ATRIUM           Index LA diam:        5.30 cm  2.45 cm/m   RA Area:     14.40 cm LA Vol (A2C):   92.6 ml  42.77 ml/m  RA Volume:   33.40 ml  15.43 ml/m LA Vol (A4C):   108.0 ml 49.88 ml/m LA Biplane Vol: 104.0 ml 48.03 ml/m  AORTIC VALVE LVOT Vmax:         97.93 cm/s LVOT Vmean:        70.433 cm/s LVOT VTI:          0.177 m AI PHT:            552 msec AR Vena Contracta: 0.40 cm  AORTA Ao Root diam: 3.70 cm Ao Asc diam:  3.70 cm  SHUNTS Systemic VTI:  0.18 m Systemic Diam: 2.40 cm Kardie Tobb DO Electronically signed by Berniece Salines DO Signature Date/Time: 05/27/2022/1:39:32 PM    Final     Cardiac Studies   Echo: 05/27/22   IMPRESSIONS     1. Patiet appears to have congenitally corrected transposition. There is  severe dilatation of the systemic (RV) ventricle and systolic function  appears to be moderately depressed. The left ventricle has moderately  decreased function. The left  ventricular internal cavity size was severely dilated. There is severe  concentric left ventricular hypertrophy. Left ventricular diastolic  function could not be evaluated.   2. Right ventricular systolic function is mildly reduced. The right  ventricular size is mildly enlarged. Severely increased right ventricular   wall thickness.   3. Left atrial size was severely dilated.   4. The mitral valve is grossly normal. Mild mitral valve regurgitation.  No evidence of mitral stenosis.   5. Tricuspid valve regurgitation is mild to moderate.   6. The aortic valve has an indeterminant number of cusps. Aortic valve  regurgitation is moderate. No aortic stenosis is present.   Conclusion(s)/Recommendation(s): Findings consistent with congenitally  corrected transposition. There is severe dilatation of the systemic (RV)  ventricle and systolic function appears to be moderately depressed. There  is mild systemic (TV) regurgitations   (TV). There is mild to moderate AR and milld PR. The great vessels run  more parralel in nature with the PV maintaining an anterior position.   FINDINGS   Left Ventricle: Patiet appears to have congenitally corrected  transposition. There is severe dilatation of the systemic (RV) ventricle  and systolic function appears to be moderately depressed. The left  ventricle has moderately decreased function. The  left ventricular internal cavity size was severely dilated. There is  severe concentric left ventricular hypertrophy. Left ventricular diastolic  function could not be evaluated.   Right Ventricle: The right ventricular size is mildly enlarged. Severely  increased right ventricular wall thickness. Right ventricular systolic  function is mildly reduced.   Left Atrium: Left atrial size was severely dilated.   Right Atrium: Right atrial size was not well visualized.   Pericardium: There is no evidence of pericardial effusion.   Mitral Valve: The mitral valve is grossly normal. Mild mitral valve  regurgitation. No evidence of mitral valve stenosis.   Tricuspid Valve: The tricuspid valve is grossly normal. Tricuspid valve  regurgitation is mild to moderate.   Aortic Valve: The aortic valve has an indeterminant number of cusps.  Aortic valve regurgitation is moderate.  Aortic regurgitation PHT measures  552 msec. No aortic stenosis is present.   Pulmonic Valve: The pulmonic valve was not well visualized. Pulmonic valve  regurgitation is mild.   IAS/Shunts: No atrial level shunt detected by color flow Doppler.    Patient Profile     56 y.o. 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  Assessment & Plan    Paroxysmal Afib with RVR: Last cardiac MRI was 10/23/2020, his right atrium was normal in size, left atrium was severely enlarged, RV EF & LV EF normal -- previously on amiodarone over the last 3 years, now presenting with breakthrough Afib and new hyperthyroidism -- bisoprolol increased to '15mg'$  daily and Dilt stopped yesterday. HRs increased.  Attempted to increase bisoprolol to '10mg'$  BID and stop lisinopril to allow for more BP room. -- HRs remain elevated in the 120s despite BB therapy. Overall he seems asymptomatic other than palpitations with faster rates. Discussed with Dr. Gwenlyn Found, if patient feels comfortable can DC home from a cardiac standpoint with his close follow up at [facility] on 8/15. Will arrange for follow up in the afib clinic.  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  He is on bisoprolol 10 mg p.o. twice daily.  There was discussion about starting digoxin however the patient did not tolerate this in the past.  He is on oral anticoagulant.  I am going to have him see an endocrinologist, Dr. Richardson Landry self, as an outpatient.  He is currently getting steroids for his hyperthyroidism managed by tried hospitalist.  From our point of  view, he is stable for discharge tomorrow.  We will arrange follow-up.  Lorretta Harp, M.D., [place], [facility], [place] [address]. [place], [place]  [id number]  [phone] 05/29/2022 11:03 AM

## 2022-05-29 NOTE — Progress Notes (Signed)
PROGRESS NOTE    Robert Braun  EHU:[id number] DOB: [birth year] DOA: 05/25/2022 PCP: Tammi Sou, MD  Brief Narrative:Robert Braun is a 56 y.o. 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  Denies any chest pain or shortness of breath.  Patient recently had followed up with his primary care physician and blood work showed features concerning for hyperthyroidism.   Patient states last month he was in [place] when he had an episode of A-fib with RVR when he had gone to the ER over there and his Bystolic dose was increased.  He converted to normal sinus rhythm without any intervention.  Takes Xarelto daily in the morning.   ED Course: In the ER patient is found to be in A-fib with RVR.  Patient declined cardioversion since patient recently had A-fib with RVR in [place] and had reverted back to normal sinus without any intervention.  Thyroid function test showed features concerning for hyperthyroidism.  He was started on a Cardizem drip and bisoprolol was continued.  Assessment & Plan:   Principal Problem:   Atrial fibrillation with RVR (HCC) Active Problems:   Adult congenital heart disease   Hyperthyroidism  #1 paroxysmal afib with RVR -patient was maintained on amiodarone for many years.  Patient came in with A-fib RVR on amiodarone and new findings of hyperthyroidism.  Amiodarone was stopped and bisoprolol dose is being increased today to 10 mg twice daily to max out.  Diltiazem stopped 05/27/2022.  His heart rate was elevated overnight and he felt palpitations.  Cardiology stopping lisinopril to allow room for bisoprolol.  Remains on Xarelto.   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 Echo 8/9-Patiet appears to have congenitally corrected transposition. There is  severe dilatation of the systemic (RV) ventricle and systolic function  appears to be moderately depressed. The left ventricle has moderately  decreased function. The left  ventricular internal cavity size was severely dilated. There is severe  concentric left ventricular hypertrophy. Left ventricular diastolic  function could not be evaluated.   Right ventricular systolic function is mildly reduced. The right  ventricular size is mildly enlarged. Severely increased right ventricular wall thickness.   Left atrial size was severely dilated.  The mitral valve is grossly normal. Mild mitral valve regurgitation.  No evidence of mitral stenosis. Tricuspid valve regurgitation is mild to moderate. The aortic valve has an indeterminant number of cusps. Aortic valve regurgitation is moderate. No aortic stenosis is present.   #2 hyperthyroidsm started methimazole 10 mg daily Likely from amiodarone  Thyrotropin receptor antibody is less than 1.10 normal TPO antibodies less than 9 normal Thyroid ultrasound mild diffuse heterogeneous thyroid parenchyma without discrete nodule. Will start prednisone 30 mg daily and taper to dc over the next 2 weeks.  #3 anemia chronic stable   Estimated body mass index is 22.25 kg/m as calculated from the following:   Height as of this encounter: 6' 4.75" (1.949 m).   Weight as of this encounter: 84.6 kg.  DVT prophylaxis: Xarelto  code Status: Full code  family Communication: None at bedside  disposition Plan:  Status is: Inpatient Remains inpatient appropriate because: A-fib rapid   Consultants:  Cardiology  Procedures: None  antimicrobials: None  Subjective:  He reports he had palpitations and tachycardia later yesterday was able to walk 6000 steps yesterday  He is  also very restless and anxious to be in the hospital   objective: Vitals:   05/29/22 0330 05/29/22 0345 05/29/22 0540 05/29/22 0739  BP:   105/71 107/67  Pulse: 93 (!) 102 70 82   Resp:    18  Temp:    98.3 F (36.8 C)  TempSrc:    Oral  SpO2:   99% 100%  Weight:      Height:        Intake/Output Summary (Last 24 hours) at 05/29/2022 1420 Last data filed at 05/29/2022 1320 Gross per 24 hour  Intake 1252 ml  Output --  Net 1252 ml    Filed Weights   05/26/22 0231 05/27/22 0435  Weight: 85.9 kg 84.6 kg    Examination:  General exam: Appears anxious  Respiratory system: Clear to auscultation. Respiratory effort normal. Cardiovascular system: S1 & S2 heard, irreg  No JVD, murmurs, rubs, gallops or clicks. No pedal edema. Gastrointestinal system: Abdomen is nondistended, soft and nontender. No organomegaly or masses felt. Normal bowel sounds heard. Central nervous system: Alert and oriented. No focal neurological deficits. Extremities: Symmetric 5 x 5 power. Skin: No rashes, lesions or ulcers Psychiatry: Judgement and insight appear normal. Mood & affect appropriate.     Data Reviewed: I have personally reviewed following labs and imaging studies  CBC: Recent Labs  Lab 05/25/22 1815 05/26/22 0724  WBC 8.2 6.3  HGB 12.5* 11.9*  HCT 36.4* 34.6*  MCV 90.5 90.1  PLT 275 510    Basic Metabolic Panel: Recent Labs  Lab 05/25/22 1815 05/26/22 0724 05/28/22 0425 05/29/22 0916  NA 131* 138 136 134*  K 4.1 3.7 4.3 4.1  CL 98 103 101 100  CO2 '25 28 28 28  '$ GLUCOSE 116* 108* 108* 115*  BUN '14 8 12 12  '$ CREATININE 0.78 0.69 0.81 0.79  CALCIUM 9.3 9.0 9.1 9.1  MG  --   --  2.1 2.0    GFR: Estimated Creatinine Clearance: 124.8 mL/min (by C-G formula based on SCr of 0.79 mg/dL). Liver Function Tests: Recent Labs  Lab 05/25/22 1815 05/26/22 0724  AST 18 18  ALT 22 20  ALKPHOS 37* 28*  BILITOT 0.6 0.8  PROT 7.1 6.0*  ALBUMIN 4.1 3.4*    No results for input(s): "LIPASE", "AMYLASE" in the last 168 hours. No results for input(s): "AMMONIA" in the last 168 hours. Coagulation Profile: No results for input(s): "INR", "PROTIME" in the last  168 hours. Cardiac Enzymes: No results for input(s): "CKTOTAL", "CKMB", "CKMBINDEX", "TROPONINI" in the last 168 hours. BNP (last 3 results) No results for input(s): "PROBNP" in the last 8760 hours. HbA1C: No results for input(s): "HGBA1C" in the last 72 hours. CBG: No results for input(s): "GLUCAP" in the last 168 hours. Lipid Profile: No results for input(s): "CHOL", "HDL", "LDLCALC", "TRIG", "CHOLHDL", "LDLDIRECT" in the last 72 hours. Thyroid Function Tests: No results for input(s): "TSH", "T4TOTAL", "FREET4", "T3FREE", "THYROIDAB" in the last 72 hours. Anemia Panel: No results for input(s): "VITAMINB12", "FOLATE", "FERRITIN", "TIBC", "IRON", "RETICCTPCT" in the last 72 hours. Sepsis Labs: No results for input(s): "PROCALCITON", "LATICACIDVEN" in the last 168 hours.  No results found for this or any previous visit (from the past 240 hour(s)).       Radiology Studies: No results found.      Scheduled Meds:  bisoprolol  10 mg Oral BID   methimazole  10 mg Oral Daily   predniSONE  30 mg Oral Q breakfast   rivaroxaban  20  mg Oral Daily   Continuous Infusions:   LOS: 3 days    Time spent: 35 min  Georgette Shell, MD 05/29/2022, 2:20 PM

## 2022-05-29 NOTE — Plan of Care (Signed)
  Problem: Clinical Measurements: Goal: Cardiovascular complication will be avoided Outcome: Progressing   

## 2022-05-30 DIAGNOSIS — I4891 Unspecified atrial fibrillation: Secondary | ICD-10-CM | POA: Diagnosis not present

## 2022-05-30 LAB — BASIC METABOLIC PANEL
Anion gap: 6 (ref 5–15)
BUN: 13 mg/dL (ref 6–20)
CO2: 28 mmol/L (ref 22–32)
Calcium: 9 mg/dL (ref 8.9–10.3)
Chloride: 102 mmol/L (ref 98–111)
Creatinine, Ser: 0.84 mg/dL (ref 0.61–1.24)
GFR, Estimated: >60 mL/min (ref >60)
Glucose, Bld: 110 mg/dL — ABNORMAL HIGH (ref 70–99)
Potassium: 4 mmol/L (ref 3.5–5.1)
Sodium: 136 mmol/L (ref 135–145)

## 2022-05-30 LAB — MAGNESIUM: Magnesium: 2.1 mg/dL (ref 1.7–2.4)

## 2022-05-30 MED ORDER — PREDNISONE 10 MG PO TABS: ORAL_TABLET | ORAL | 0 refills | Status: DC

## 2022-05-30 MED ORDER — METHIMAZOLE 10 MG PO TABS: 10.0000 mg | ORAL_TABLET | Freq: Every day | ORAL | 4 refills | Status: DC

## 2022-05-30 MED ORDER — BISOPROLOL FUMARATE 10 MG PO TABS: 10.0000 mg | ORAL_TABLET | Freq: Two times a day (BID) | ORAL | 4 refills | Status: AC

## 2022-05-30 NOTE — Discharge Summary (Signed)
Physician Discharge Summary  Robert Braun GNF:[id number] DOB: 1966-03-09 DOA: 05/25/2022  PCP: Tammi Sou, MD  Admit date: 05/25/2022 Discharge date: 05/30/2022  Admitted From: Home Disposition: Home   Recommendations for Outpatient Follow-up:  Follow up with PCP in 1-2 weeks Please obtain BMP/CBC in one week Please follow up at [facility] cardiology Follow-up with Dr. Gwenlyn Found cardiology  [place]: None Equipment/Devices: None   Discharge Condition: stable  CODE STATUS: Full code Diet recommendation: Cardiac Brief/Interim Summary:  Robert Braun is a 56 y.o. 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  Denies any chest pain or shortness of breath.  Patient recently had followed up with his primary care physician and blood work showed features concerning for hyperthyroidism.  Patient stated last month he was in [place] when he had an episode of A-fib with RVR when he had gone to the ER over there and his Bystolic dose was increased.  He converted to normal sinus rhythm without any intervention.  Takes Xarelto daily in the morning.   ED Course: In the ER patient is found to be in A-fib with RVR.  Patient declined cardioversion since patient recently had A-fib with RVR in [place] and had reverted back to normal sinus without any intervention.  Thyroid function test showed features concerning for hyperthyroidism.  He was started on a Cardizem drip and bisoprolol was continued. Discharge Diagnoses:  Principal Problem:   Atrial fibrillation with RVR ([place]) Active Problems:   Adult congenital heart disease   Hyperthyroidism  #1 paroxysmal afib with RVR -patient was maintained on amiodarone for many years.  Patient came in with A-fib RVR on amiodarone and new findings of hyperthyroidism.  Amiodarone was stopped and bisoprolol dose was increased  to 10 mg twice a day.  He was initially treated with diltiazem drip which was stopped.  Continue Xarelto.   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 Echo 8/9-Patiet appears to have congenitally corrected transposition. There is severe dilatation of the systemic (RV) ventricle and systolic function  appears to be moderately depressed. The left ventricle has moderately  decreased function. The left  ventricular internal cavity size was severely dilated. There is severe  concentric left ventricular hypertrophy. Left ventricular diastolic  function could not be evaluated.   Right ventricular systolic function is mildly reduced. The right  ventricular size is mildly enlarged. Severely increased right ventricular wall thickness.   Left atrial size was severely dilated.  The mitral valve is grossly normal. Mild mitral valve regurgitation.  No evidence of mitral stenosis. Tricuspid valve regurgitation is mild to moderate. The aortic valve has an indeterminant number of cusps. Aortic valve regurgitation is moderate. No aortic stenosis is present. Zestril was stopped to allow for increased titration of bisoprolol to 10 twice daily.     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 Thyrotropin receptor antibody is less than 1.10 normal TPO antibodies less than 9 normal Thyroid ultrasound mild diffuse heterogeneous thyroid parenchyma without discrete nodule. He was started on prednisone 30 mg daily with slow taper to stop in the next 2 weeks.   #3 anemia chronic stable    Estimated body mass index is 22.16 kg/m as calculated from the following:   Height as of this encounter: 6' 4.75" (1.949 m).   Weight as of this encounter: 84.2 kg.  Discharge Instructions  Discharge Instructions     Call MD for:  difficulty breathing, headache or visual disturbances   Complete by: As directed     Call MD for:  persistant dizziness or light-headedness   Complete by: As directed    Call MD for:  persistant nausea and vomiting   Complete by: As directed    Diet - low sodium heart healthy   Complete by: As directed    Increase activity slowly   Complete by: As directed       Allergies as of 05/30/2022   No Known Allergies      Medication List     STOP taking these medications    amiodarone 200 MG tablet Commonly known as: PACERONE   lisinopril 40 MG tablet Commonly known as: ZESTRIL       TAKE these medications    bisoprolol 10 MG tablet Commonly known as: ZEBETA Take 1 tablet (10 mg total) by mouth 2 (two) times daily. What changed: when to take this   methimazole 10 MG tablet Commonly known as: TAPAZOLE Take 1 tablet (10 mg total) by mouth daily. Start taking on: May 31, 2022   Multi-Vitamin tablet Take 1 tablet by mouth daily.   predniSONE 10 MG tablet Commonly known as: DELTASONE Take 30 mg daily from 8/13 to 8/16 Take 20 mg daily from 8/17 to 8/20 Take 10 mg daily till all tablets are done Start taking on: May 31, 2022   Xarelto 20 MG Tabs tablet Generic drug: rivaroxaban Take 20 mg by mouth daily.        Follow-up [facility] Follow up.   Why: Date/Time Provider Department Center Visit Type  06/02/2022 10:30 AM (Arrive by 10:00 AM) [place] 4 CARD CONGENITAL ECHO at [facility] [place] 4th Floor  06/02/2022 2:00 PM (Arrive by 1:30 PM) [facility] [place] 4th Floor 06/10/2022 11:20 AM (Arrive by 10:50 AM) Selinda Orion, PA [facility] Contact information: [place] [address] [phone]         Tammi Sou, MD Follow up.   Specialty: Family Medicine Contact information: [address] [place] [address] [phone]         Lorretta Harp, MD Follow up.    Specialties: Cardiology, Radiology Contact information: [address] [place] [address] [phone]                No Known Allergies  Consultations: Cardiology   Procedures/Studies: ECHOCARDIOGRAM COMPLETE  Result Date: 05/27/2022    ECHOCARDIOGRAM REPORT   Patient Name:   Robert Braun Date of Exam: 05/27/2022 Medical Rec #:  [id number]          Height:       76.7 in Accession #:    [id number]         Weight:       186.4 lb Date of Birth:  09-28-1966          BSA:          2.165 m Patient Age:    26 years           BP:           101/61 mmHg Patient Gender: M                  HR:  86 bpm. Exam Location:  Inpatient Procedure: 2D Echo, Cardiac Doppler and Color Doppler Indications:    I48.91* Unspeicified atrial fibrillation  History:        Patient has prior history of Echocardiogram examinations, most                 recent 10/11/2018. Congenitally corrected transposition of great                 arteries, Arrythmias:Atrial Fibrillation; Risk                 Factors:Hypertension.  Sonographer:    Bernadene Person RDCS Referring Phys: [place]  1. Patiet appears to have congenitally corrected transposition. There is severe dilatation of the systemic (RV) ventricle and systolic function appears to be moderately depressed. The left ventricle has moderately decreased function. The left ventricular internal cavity size was severely dilated. There is severe concentric left ventricular hypertrophy. Left ventricular diastolic function could not be evaluated.  2. Right ventricular systolic function is mildly reduced. The right ventricular size is mildly enlarged. Severely increased right ventricular wall thickness.  3. Left atrial size was severely dilated.  4. The mitral valve is grossly normal. Mild mitral valve regurgitation. No evidence of mitral stenosis.  5. Tricuspid valve regurgitation is mild to moderate.  6. The aortic valve has an  indeterminant number of cusps. Aortic valve regurgitation is moderate. No aortic stenosis is present. Conclusion(s)/Recommendation(s): Findings consistent with congenitally corrected transposition. There is severe dilatation of the systemic (RV) ventricle and systolic function appears to be moderately depressed. There is mild systemic (TV) regurgitations  (TV). There is mild to moderate AR and milld PR. The great vessels run more parralel in nature with the PV maintaining an anterior position. FINDINGS  Left Ventricle: Patiet appears to have congenitally corrected transposition. There is severe dilatation of the systemic (RV) ventricle and systolic function appears to be moderately depressed. The left ventricle has moderately decreased function. The left ventricular internal cavity size was severely dilated. There is severe concentric left ventricular hypertrophy. Left ventricular diastolic function could not be evaluated. Right Ventricle: The right ventricular size is mildly enlarged. Severely increased right ventricular wall thickness. Right ventricular systolic function is mildly reduced. Left Atrium: Left atrial size was severely dilated. Right Atrium: Right atrial size was not well visualized. Pericardium: There is no evidence of pericardial effusion. Mitral Valve: The mitral valve is grossly normal. Mild mitral valve regurgitation. No evidence of mitral valve stenosis. Tricuspid Valve: The tricuspid valve is grossly normal. Tricuspid valve regurgitation is mild to moderate. Aortic Valve: The aortic valve has an indeterminant number of cusps. Aortic valve regurgitation is moderate. Aortic regurgitation PHT measures 552 msec. No aortic stenosis is present. Pulmonic Valve: The pulmonic valve was not well visualized. Pulmonic valve regurgitation is mild. IAS/Shunts: No atrial level shunt detected by color flow Doppler.  LEFT VENTRICLE PLAX 2D LVIDd:         7.20 cm LVIDs:         5.70 cm LV PW:         1.70 cm LV  IVS:        1.70 cm LVOT diam:     2.40 cm LV SV:         80 LV SV Index:   37 LVOT Area:     4.52 cm  LV Volumes (MOD) LV vol d, MOD A2C: 153.0 ml LV vol d, MOD A4C: 118.0 ml LV vol s,  MOD A2C: 73.0 ml LV vol s, MOD A4C: 73.2 ml LV SV MOD A2C:     80.0 ml LV SV MOD A4C:     118.0 ml LV SV MOD BP:      63.0 ml RIGHT VENTRICLE RV S prime:     13.70 cm/s TAPSE (M-mode): 1.6 cm LEFT ATRIUM              Index        RIGHT ATRIUM           Index LA diam:        5.30 cm  2.45 cm/m   RA Area:     14.40 cm LA Vol (A2C):   92.6 ml  42.77 ml/m  RA Volume:   33.40 ml  15.43 ml/m LA Vol (A4C):   108.0 ml 49.88 ml/m LA Biplane Vol: 104.0 ml 48.03 ml/m  AORTIC VALVE LVOT Vmax:         97.93 cm/s LVOT Vmean:        70.433 cm/s LVOT VTI:          0.177 m AI PHT:            552 msec AR Vena Contracta: 0.40 cm  AORTA Ao Root diam: 3.70 cm Ao Asc diam:  3.70 cm  SHUNTS Systemic VTI:  0.18 m Systemic Diam: 2.40 cm Kardie Tobb DO Electronically signed by Berniece Salines DO Signature Date/Time: 05/27/2022/1:39:32 PM    Final    US THYROID  Result Date: 05/26/2022 CLINICAL DATA:  Hyperthyroidism EXAM: THYROID ULTRASOUND TECHNIQUE: Ultrasound examination of the thyroid gland and adjacent soft tissues was performed. COMPARISON:  None available FINDINGS: Parenchymal Echotexture: Mildly heterogenous Isthmus: 0.5 cm Right lobe: 6 x 3 x 2.3 x 2.3 cm Left lobe: 6.1 x 1.9 x 2.7 cm _________________________________________________________ Estimated total number of nodules >/= 1 cm: 0 Number of spongiform nodules >/=  2 cm not described below (TR1): 0 Number of mixed cystic and solid nodules >/= 1.5 cm not described below (TR2): 0 _________________________________________________________ No discrete nodules are seen within the thyroid gland. IMPRESSION: Mild diffuse heterogeneous thyroid parenchyma without discrete nodule. The above is in keeping with the ACR TI-RADS recommendations - J Am Coll Radiol 2017;14:587-595. Electronically Signed    By: Miachel Roux M.D.   On: 05/26/2022 08:59   DG Chest 1 View  Result Date: 05/25/2022 CLINICAL DATA:  Palpitations. EXAM: CHEST  1 VIEW COMPARISON:  None Available. FINDINGS: The heart size and mediastinal contours are within normal limits. Both lungs are clear. The visualized skeletal structures are unremarkable. IMPRESSION: No active disease. Electronically Signed   By: Virgina Norfolk M.D.   On: 05/25/2022 18:55   (Echo, Carotid, EGD, Colonoscopy, ERCP)    Subjective:  Patient is anxious to go home had a better night Discharge Exam: Vitals:   05/30/22 0625 05/30/22 0721  BP: 111/66 107/67  Pulse: 72 86  Resp: 20 18  Temp: 98.1 F (36.7 C) 98.3 F (36.8 C)  SpO2: 100% 100%   Vitals:   05/29/22 1945 05/30/22 0047 05/30/22 0625 05/30/22 0721  BP: 134/69  111/66 107/67  Pulse: 83  72 86  Resp: '20  20 18  '$ Temp: 98 F (36.7 C)  98.1 F (36.7 C) 98.3 F (36.8 C)  TempSrc: Oral  Oral Oral  SpO2: 100%  100% 100%  Weight:  84.2 kg    Height:        General: Pt is alert, awake, not in  acute distress Cardiovascular: irRegular S1/S2 +, no rubs, no gallops Respiratory: CTA bilaterally, no wheezing, no rhonchi Abdominal: Soft, NT, ND, bowel sounds + Extremities: no edema, no cyanosis    The results of significant diagnostics from this hospitalization (including imaging, microbiology, ancillary and laboratory) are listed below for reference.     Microbiology: No results found for this or any previous visit (from the past 240 hour(s)).   Labs: BNP (last 3 results) No results for input(s): "BNP" in the last 8760 hours. Basic Metabolic Panel: Recent Labs  Lab 05/25/22 1815 05/26/22 0724 05/28/22 0425 05/29/22 0916 05/30/22 0326  NA 131* 138 136 134* 136  K 4.1 3.7 4.3 4.1 4.0  CL 98 103 101 100 102  CO2 '25 28 28 28 28  '$ GLUCOSE 116* 108* 108* 115* 110*  BUN '14 8 12 12 13  '$ CREATININE 0.78 0.69 0.81 0.79 0.84  CALCIUM 9.3 9.0 9.1 9.1 9.0  MG  --   --  2.1 2.0 2.1    Liver Function Tests: Recent Labs  Lab 05/25/22 1815 05/26/22 0724  AST 18 18  ALT 22 20  ALKPHOS 37* 28*  BILITOT 0.6 0.8  PROT 7.1 6.0*  ALBUMIN 4.1 3.4*   No results for input(s): "LIPASE", "AMYLASE" in the last 168 hours. No results for input(s): "AMMONIA" in the last 168 hours. CBC: Recent Labs  Lab 05/25/22 1815 05/26/22 0724  WBC 8.2 6.3  HGB 12.5* 11.9*  HCT 36.4* 34.6*  MCV 90.5 90.1  PLT 275 269   Cardiac Enzymes: No results for input(s): "CKTOTAL", "CKMB", "CKMBINDEX", "TROPONINI" in the last 168 hours. BNP: Invalid input(s): "POCBNP" CBG: No results for input(s): "GLUCAP" in the last 168 hours. D-Dimer No results for input(s): "DDIMER" in the last 72 hours. Hgb A1c No results for input(s): "HGBA1C" in the last 72 hours. Lipid Profile No results for input(s): "CHOL", "HDL", "LDLCALC", "TRIG", "CHOLHDL", "LDLDIRECT" in the last 72 hours. Thyroid function studies No results for input(s): "TSH", "T4TOTAL", "T3FREE", "THYROIDAB" in the last 72 hours.  Invalid input(s): "FREET3" Anemia work up No results for input(s): "VITAMINB12", "FOLATE", "FERRITIN", "TIBC", "IRON", "RETICCTPCT" in the last 72 hours. Urinalysis    Component Value Date/Time   BILIRUBINUR Negative 10/18/2018 1213   PROTEINUR Negative 10/18/2018 1213   UROBILINOGEN 0.2 10/18/2018 1213   NITRITE Negative 10/18/2018 1213   LEUKOCYTESUR Negative 10/18/2018 1213   Sepsis Labs Recent Labs  Lab 05/25/22 1815 05/26/22 0724  WBC 8.2 6.3   Microbiology No results found for this or any previous visit (from the past 240 hour(s)).   Time coordinating discharge: 39 minutes  SIGNED:   Georgette Shell, MD  [facility] 05/30/2022, 2:59 PM

## 2022-05-30 NOTE — Progress Notes (Signed)
Progress Note  Patient Name: CAUY MELODY Date of Encounter: 05/30/2022  [facility] Cardiologist: [facility]  Subjective   Heart rates are well controlled. Patient feels read to go home. Long term he is worried about managing his AF, the household, and things with his wife and daugther as they are very busy.  Inpatient Medications    Scheduled Meds:  bisoprolol  10 mg Oral BID   methimazole  10 mg Oral Daily   predniSONE  30 mg Oral Q breakfast   rivaroxaban  20 mg Oral Daily   Continuous Infusions:  PRN Meds:    Vital Signs    Vitals:   05/29/22 1945 05/30/22 0047 05/30/22 0625 05/30/22 0721  BP: 134/69  111/66 107/67  Pulse: 83  72 86  Resp: '20  20 18  '$ Temp: 98 F (36.7 C)  98.1 F (36.7 C) 98.3 F (36.8 C)  TempSrc: Oral  Oral Oral  SpO2: 100%  100% 100%  Weight:  84.2 kg    Height:        Intake/Output Summary (Last 24 hours) at 05/30/2022 0927 Last data filed at 05/30/2022 0047 Gross per 24 hour  Intake 1314 ml  Output 300 ml  Net 1014 ml      05/30/2022   12:47 AM 05/27/2022    4:35 AM 05/26/2022    2:31 AM  Last 3 Weights  Weight (lbs) 185 lb 10 oz 186 lb 6.4 oz 189 lb 4.8 oz  Weight (kg) 84.2 kg 84.55 kg 85.866 kg      Telemetry    Afib rates 190-110s - Personally Reviewed  ECG    No new tracing  Physical Exam   GEN: No acute distress.   Neck: No JVD Cardiac: Irreg Irreg, systolic and diastolic murmurs rubs, or gallops.  Respiratory: Clear to auscultation bilaterally. GI: Soft, nontender, non-distended  MS: No edema; No deformity. Neuro:  Nonfocal  Psych: Normal affect   Labs    High Sensitivity Troponin:   Recent Labs  Lab 05/25/22 1815 05/25/22 2007  TROPONINIHS 14 27*     Chemistry Recent Labs  Lab 05/25/22 1815 05/26/22 0724 05/28/22 0425 05/29/22 0916 05/30/22 0326  NA 131* 138 136 134* 136  K 4.1 3.7 4.3 4.1 4.0  CL 98 103 101 100 102  CO2 '25 28 28 28 28  '$ GLUCOSE 116* 108* 108* 115* 110*  BUN  '14 8 12 12 13  '$ CREATININE 0.78 0.69 0.81 0.79 0.84  CALCIUM 9.3 9.0 9.1 9.1 9.0  MG  --   --  2.1 2.0 2.1  PROT 7.1 6.0*  --   --   --   ALBUMIN 4.1 3.4*  --   --   --   AST 18 18  --   --   --   ALT 22 20  --   --   --   ALKPHOS 37* 28*  --   --   --   BILITOT 0.6 0.8  --   --   --   GFRNONAA >60 >60 >60 >60 >60  ANIONGAP '8 7 7 6 6    '$ Lipids No results for input(s): "CHOL", "TRIG", "HDL", "LABVLDL", "LDLCALC", "CHOLHDL" in the last 168 hours.  Hematology Recent Labs  Lab 05/25/22 1815 05/26/22 0724  WBC 8.2 6.3  RBC 4.02* 3.84*  HGB 12.5* 11.9*  HCT 36.4* 34.6*  MCV 90.5 90.1  MCH 31.1 31.0  MCHC 34.3 34.4  RDW 11.3* 11.4*  PLT 275  269   Thyroid  No results for input(s): "TSH", "FREET4" in the last 168 hours.   BNPNo results for input(s): "BNP", "PROBNP" in the last 168 hours.  DDimer No results for input(s): "DDIMER" in the last 168 hours.   Radiology    No results found.  Cardiac Studies   Echo: 05/27/22   IMPRESSIONS     1. Patiet appears to have congenitally corrected transposition. There is  severe dilatation of the systemic (RV) ventricle and systolic function  appears to be moderately depressed. The left ventricle has moderately  decreased function. The left  ventricular internal cavity size was severely dilated. There is severe  concentric left ventricular hypertrophy. Left ventricular diastolic  function could not be evaluated.   2. Right ventricular systolic function is mildly reduced. The right  ventricular size is mildly enlarged. Severely increased right ventricular  wall thickness.   3. Left atrial size was severely dilated.   4. The mitral valve is grossly normal. Mild mitral valve regurgitation.  No evidence of mitral stenosis.   5. Tricuspid valve regurgitation is mild to moderate.   6. The aortic valve has an indeterminant number of cusps. Aortic valve  regurgitation is moderate. No aortic stenosis is present.    Conclusion(s)/Recommendation(s): Findings consistent with congenitally  corrected transposition. There is severe dilatation of the systemic (RV)  ventricle and systolic function appears to be moderately depressed. There  is mild systemic (TV) regurgitations   (TV). There is mild to moderate AR and milld PR. The great vessels run  more parralel in nature with the PV maintaining an anterior position.   FINDINGS   Left Ventricle: Patiet appears to have congenitally corrected  transposition. There is severe dilatation of the systemic (RV) ventricle  and systolic function appears to be moderately depressed. The left  ventricle has moderately decreased function. The  left ventricular internal cavity size was severely dilated. There is  severe concentric left ventricular hypertrophy. Left ventricular diastolic  function could not be evaluated.   Right Ventricle: The right ventricular size is mildly enlarged. Severely  increased right ventricular wall thickness. Right ventricular systolic  function is mildly reduced.   Left Atrium: Left atrial size was severely dilated.   Right Atrium: Right atrial size was not well visualized.   Pericardium: There is no evidence of pericardial effusion.   Mitral Valve: The mitral valve is grossly normal. Mild mitral valve  regurgitation. No evidence of mitral valve stenosis.   Tricuspid Valve: The tricuspid valve is grossly normal. Tricuspid valve  regurgitation is mild to moderate.   Aortic Valve: The aortic valve has an indeterminant number of cusps.  Aortic valve regurgitation is moderate. Aortic regurgitation PHT measures  552 msec. No aortic stenosis is present.   Pulmonic Valve: The pulmonic valve was not well visualized. Pulmonic valve  regurgitation is mild.   IAS/Shunts: No atrial level shunt detected by color flow Doppler.    Patient Profile     56 y.o. 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  Assessment & Plan    Paroxysmal Afib with RVR: Complicated hyperthyroidism Complicated by L-TGA: severe LA dilation, - System ventricular has severe dilation moderately reduced function - sub-pulmonic ventricle with moderately reduced function -  Mild to moderate TR - Mild-moderate AI; 2d VC 3.5 mm; suspect similar to prior given CMR RF of 15% (lower limit of moderate)  - asymptomatic on bisoprolol  - prior digoxin intolerance; unable to take amio due to above (AIT) - long term may need Tikoysn; Qtc ~ 460 ms - may improved with thyroid control - no barriers to DC; discussed care with his congential cardiologist who will see him next week   For questions or updates, please contact [facility] Please consult [email or web address] for contact info under    Rudean Haskell, MD [place]  [address] [place], [place] [address] [phone]  9:54 AM

## 2022-06-01 ENCOUNTER — Telehealth: Payer: Self-pay

## 2022-06-01 NOTE — Telephone Encounter (Signed)
Transition Care Management Unsuccessful Follow-up Telephone Call  Date of discharge and from where:  05/30/22 from [facility]  Attempts:  1st Attempt  Reason for unsuccessful TCM follow-up call:  Unable to leave message

## 2022-06-02 ENCOUNTER — Telehealth: Payer: Self-pay

## 2022-06-02 ENCOUNTER — Encounter: Payer: Self-pay | Admitting: Family Medicine

## 2022-06-02 NOTE — Telephone Encounter (Signed)
No further action needed.

## 2022-06-02 NOTE — Telephone Encounter (Signed)
That's fine

## 2022-06-02 NOTE — Telephone Encounter (Signed)
Transition Care Management Follow-up Telephone Call Date of discharge and from where: [place] 05/30/22 How have you been since you were released from the hospital? better Any questions or concerns? No  Items Reviewed: Did the pt receive and understand the discharge instructions provided? Yes  Medications obtained and verified? Yes  Other? No  Any new allergies since your discharge? Yes  Dietary orders reviewed? Yes Do you have support at home? Yes   Home Care and Equipment/Supplies: Were home health services ordered? no If so, what is the name of the agency?   Has the agency set up a time to come to the patient's home? no Were any new equipment or medical supplies ordered?  No What is the name of the medical supply agency?  Were you able to get the supplies/equipment? not applicable Do you have any questions related to the use of the equipment or supplies? No  Functional Questionnaire: (I = Independent and D = Dependent) ADLs: I  Bathing/Dressing- I  Meal Prep- I  Eating- I  Maintaining continence- I  Transferring/Ambulation- I  Managing Meds- I  Follow up appointments reviewed:  PCP Hospital f/u appt confirmed? No   Specialist Hospital f/u appt confirmed? Yes  Scheduled to see cardiology on 06/02/22. Are transportation arrangements needed? No  If their condition worsens, is the pt aware to call PCP or go to the Emergency Dept.? Yes Was the patient provided with contact information for the PCP's office or ED? Yes Was to pt encouraged to call back with questions or concerns? Yes

## 2022-06-23 ENCOUNTER — Ambulatory Visit (HOSPITAL_COMMUNITY)
Admission: RE | Admit: 2022-06-23 | Discharge: 2022-06-23 | Disposition: A | Discharge status: Home / Self Care | Payer: 59 | Source: Ambulatory Visit | Attending: Nurse Practitioner | Admitting: Nurse Practitioner

## 2022-06-23 VITALS — BP 122/80 | HR 128 | Ht 76.75 in | Wt 194.0 lb

## 2022-06-23 DIAGNOSIS — E0590 Thyrotoxicosis, unspecified without thyrotoxic crisis or storm: Secondary | ICD-10-CM | POA: Insufficient documentation

## 2022-06-23 DIAGNOSIS — Q205 Discordant atrioventricular connection: Secondary | ICD-10-CM | POA: Diagnosis not present

## 2022-06-23 DIAGNOSIS — I4891 Unspecified atrial fibrillation: Secondary | ICD-10-CM | POA: Diagnosis not present

## 2022-06-24 ENCOUNTER — Encounter (HOSPITAL_COMMUNITY): Payer: Self-pay | Admitting: Nurse Practitioner

## 2022-06-24 NOTE — Progress Notes (Addendum)
Primary Care Physician: Tammi Sou, MD Referring Physician:  Radene Gunning, PA [facility]  EP: Dr. Delle Reining is a 56 y.o. 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 Denies any chest pain or shortness of breath.  Patient recently had followed up with his primary care physician and blood work showed features concerning for hyperthyroidism.   Patient stated last month he was in [place] when he had an episode of A-fib with RVR when he had gone to the ER  there and his Bystolic dose was increased.  He converted to normal sinus rhythm without any intervention.  Takes Xarelto daily in the morning.  Thyroid function test showed features concerning for hyperthyroidism.  He was started on a Cardizem drip and bisoprolol was continued. Amiodarone  was stopped and bisoprolol was increased to 10 mg daily. Methimazole was started  at 10 mg daily.Thyroid ultrasound mild diffuse heterogeneous thyroid parenchyma without discrete nodule. He was started on prednisone 30 mg daily with slow taper to stop over the next 2 weeks. Anemia thought to be stable, chronic. He declined cardioversion.  He was seen in the EP clinic at [facility] , who manage his care and the plan was to start Multaq 400 mg bid and after 2 days, plan for cardioversion. This was discussed with the pt today. He has a lot of questions and just had thyroid studies repeated late last week with endocrinology  and has not gotten results, he wants to review these with his MD before committing to [facility] and cardioversion. He was concerned re liver damage with Multaq and was reassured that I have not personally seen this but a liver function study would be done after start of drug. He has RVR in the 120's in the office but he states he is anxious and is running much slower at home. He  states completely asymptomatic.    Today, he denies symptoms of palpitations, chest pain, shortness of breath, orthopnea, PND, lower extremity edema, dizziness, presyncope, syncope, or neurologic sequela. The patient is tolerating medications without difficulties and is otherwise without complaint today.   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 Gwenlyn Found referred him to a-fib clinic for consideration or redo CV or ablation. After eval by a-fib clinic, he was referred to [facility] to adult congenital specialist and EP: amiodarone started, cMRI to be repeated.   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  Also Systemic RV EF 41%, Mild systemic AR and mild/mod systemic AV valve regurg.  Normal fxn of subpulmonic LV.   History of adenomatous polyp of colon 09/2019   recall 09/2024   History of kidney stones    passed stone - no surgery   History of pneumonia 10/2017   Hypertension    Migraines    occasional   Recurrent abdominal pain 2020   RLQ; blood and imaging eval unremarkable for cause, colonoscopy showed adenoma x 1, otherwise normal   Past Surgical History:  Procedure Laterality Date   Cardiac MRI  06/2019   EF 55%   CARDIAC SURGERY  correction of transposition of the great arteries noted on cardiac MRI done in the [place]., pt denies heart surgery   CARDIOVERSION  03/2018   pt only briefly held sinus rhythm in [place]   CARDIOVERSION N/A 01/12/2019   Procedure: CARDIOVERSION;  Surgeon: Sanda Klein, MD;  Location: [place];  Service: Cardiovascular;  Laterality: N/A;   COLONOSCOPY W/ POLYPECTOMY  09/2019   adenoma->recall 09/2024   TRANSTHORACIC [place]CHOCARDIOGRAM     [place]pt cannot [place] any results.    TRANSTHORACIC ECHOCARDIOGRAM  09/2018   EF for both ventricles 25%, severe systemic AV (tricuspid) regurg--> [facility] clinic. 05/2022 mod red RV (systemic) and LV function, mod AR   WISDOM TOOTH EXTRACTION      Current Outpatient Medications  Medication Sig Dispense Refill   bisoprolol (ZEBETA) 10 MG tablet Take 1 tablet (10 mg total) by mouth 2 (two) times daily. 60 tablet 4   methimazole (TAPAZOLE) 10 MG tablet Take 1 tablet (10 mg total) by mouth daily. 30 tablet 4   Multiple Vitamin (MULTI-VITAMIN) tablet Take 1 tablet by mouth daily.     sacubitril-valsartan (ENTRESTO) 24-26 MG Take 1 tablet by mouth 2 (two) times daily.     spironolactone (ALDACTONE) 25 MG tablet Take 12.5 mg by mouth daily.     XARELTO 20 MG TABS tablet Take 20 mg by mouth daily.     No current facility-administered medications for this encounter.    No Known Allergies  Social History   Socioeconomic History   Marital status: Married    Spouse name: Not on file   Number of children: Not on file   Years of education: Not on file   Highest education level: Not on file  Occupational History   Not on file  Tobacco Use   Smoking status: Former    Years: 31.00    Types: Cigarettes    Quit date: 11/19/2013    Years since quitting: 8.6   Smokeless tobacco: Never  Vaping Use   Vaping Use: Every day  Substance and Sexual Activity   Alcohol use: Yes    Alcohol/week: 4.0 - 8.0 standard drinks of alcohol    Types: 4 - 8 Glasses of wine per week    Comment: 1-2 bottles wine on Weekends (Fri-Sun)   Drug use: Never   Sexual activity: Not on file  Other Topics Concern   Not on file  Social History Narrative   Married, 1 daughter.   Orig from [place].   Lived in [place] 20 yrs, then moved to [facility] 04/2018.   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   Smoker until 2015-->switched to vap.   Alcohol: some beer and wine several days per week--sometimes >3 glasses of wine.   No drugs.   Social Determinants of  Health   Financial Resource Strain: Not on file  Food Insecurity: Not on file  Transportation Needs: Not on file  Physical Activity: Not on file  Stress: Not on file  Social Connections: Not on file  Intimate Partner Violence: Not on file    Family History  Problem Relation Age of Onset   Parkinson's disease Mother    Hypertension Father    Colon cancer Neg Hx    Esophageal cancer Neg Hx    Rectal cancer Neg Hx    Stomach cancer Neg Hx     ROS- All systems are reviewed and negative except as per the HPI above  Physical Exam: Vitals:   06/23/22  1431  BP: 122/80  Pulse: (!) 128  Weight: 88 kg  Height: 6' 4.75" (1.949 m)   Wt Readings from Last 3 Encounters:  06/23/22 88 kg  05/30/22 84.2 kg  05/19/22 87.8 kg    Labs: Lab Results  Component Value Date   NA 136 05/30/2022   K 4.0 05/30/2022   CL 102 05/30/2022   CO2 28 05/30/2022   GLUCOSE 110 (H) 05/30/2022   BUN 13 05/30/2022   CREATININE 0.84 05/30/2022   CALCIUM 9.0 05/30/2022   MG 2.1 05/30/2022   No results found for: "INR" Lab Results  Component Value Date   CHOL 136 05/21/2022   HDL 49.60 05/21/2022   LDLCALC 66 05/21/2022   TRIG 101.0 05/21/2022     GEN- The patient is well appearing, alert and oriented x 3 today.   Head- normocephalic, atraumatic Eyes-  Sclera clear, conjunctiva pink Ears- hearing intact Oropharynx- clear Neck- supple, no JVP Lymph- no cervical lymphadenopathy Lungs- Clear to ausculation bilaterally, normal work of breathing Heart- Regular rate and rhythm, no murmurs, rubs or gallops, PMI not laterally displaced GI- soft, NT, ND, + BS Extremities- no clubbing, cyanosis, or edema MS- no significant deformity or atrophy Skin- no rash or lesion Psych- euthymic mood, full affect Neuro- strength and sensation are intact  EKG-Vent. rate 128 BPM PR interval * ms QRS duration 122 ms QT/QTcB 266/388 ms P-R-T axes * -40 157 Atrial fibrillation with rapid ventricular  response Left axis deviation Confirmed by Lars Mage [phone]) on 06/23/2022 8:46:25 PM  Echo-  1. [place] appears to have congenitally corrected transposition. There is  severe dilatation of the systemic (RV) ventricle and systolic function  appears to be moderately depressed. The left ventricle has moderately  decreased function. The left  ventricular internal cavity size was severely dilated. There is severe  concentric left ventricular hypertrophy. Left ventricular diastolic  function could not be evaluated.   2. Right ventricular systolic function is mildly reduced. The right  ventricular size is mildly enlarged. Severely increased right ventricular  wall thickness.   3. Left atrial size was severely dilated.   4. The mitral valve is grossly normal. Mild mitral valve regurgitation.  No evidence of mitral stenosis.   5. Tricuspid valve regurgitation is mild to moderate.   6. The aortic valve has an indeterminant number of cusps. Aortic valve  regurgitation is moderate. No aortic stenosis is present.   Assessment and Plan:  1. Afib  Amiodarone was recently stopped with onset of hyperthyroidism  He is now in afib with RVR( discussed my concerns with prolonged RVR) The plan at his primary EP group at [facility] was to start multaq 400 mg bid x 2 days and then cardiovert  Per note at [facility] , Dr. Marcello Moores  was ok with restart of amiodarone and cardioversion, but offered multaq with thyroid concerns, felt Tikosyn was not an option qtc was 450-480 on previous EKG's in [place]. Felt  ablation not an option due to TGA, he had a monitor placed there.he was encouraged not to vape or drink alcohol. Pt deferred cardioversion in the ER and again at [facility], wanting it done locally  We discussed today and he wants to delay  until he hears  the latest on the status of his thyroid studies drawn last mid week  I answered his concern's re Multaq  I want him to  check his HR's at home to make sure he is well rate  controlled which he feels he  is, if not contact me Hopefully he will hear from his  PCP soon and then he will contact me and we can get cardioversion/multaq scheduled  Continue bisoprolol 10 mg bid  Continue xarelto 20 mg qd for a CHA2DS2VASc  score of 1   I expect to hear back from the pt in the next couple of days  Butch Penny C. Tametha Banning, [facility] [address] [place], [place] [address] [phone]

## 2022-07-08 ENCOUNTER — Telehealth (HOSPITAL_COMMUNITY): Payer: Self-pay | Admitting: Nurse Practitioner

## 2022-07-08 NOTE — Telephone Encounter (Signed)
I reached out to [name], [place], [facility], for my concerns regarding pt's hyperthyroidism and afib with RVR. He received results from  his local  endocrinologist re his latest thyroid studies which  showed a TSH at 0.00 and Free T4 at 2.9. Mr. Robert Braun had asked for pt to start Multaq 400 mg bid x 2 days and then cardiovert. However, pt had some concerns re the safety of Mlutaq as far as the liver is concerned and the timing of the cardioversion as he wanted to hear from his endocrinologist re the above  thyroid studies. His tapazole was recently increased to 20 mg daily and the plan was to recheck thyroid studies again in 4 weeks. In the interim, I asked pt was he rate controlled at home and he today sent screen shots of his apple watch that show poor control, most hr's still above 100 with bisoprolol increased a few weeks ago to 20 mg daily.  He states that he feels ok but his feet and lower legs have started swelling.   Mr. Robert Braun will reach out to pt to discuss the above situation. I will be glad to assist with local cardioversion but feel the management of his current health concerns  should be with his [facility] established cardiologist and electrophysiologist.

## 2022-07-17 ENCOUNTER — Telehealth: Payer: Self-pay

## 2022-07-17 NOTE — Telephone Encounter (Signed)
Transition Care Management Follow-up Telephone Call Date of discharge and from where: 07/15/22 [facility] How have you been since you were released from the hospital? no Any questions or concerns? No  Items Reviewed: Did the pt receive and understand the discharge instructions provided? Yes  Medications obtained and verified? Yes  Other? No  Any new allergies since your discharge? No  Dietary orders reviewed? No Do you have support at home? Yes   Home Care and Equipment/Supplies: Were home health services ordered? not applicable If so, what is the name of the agency? N/a  Has the agency set up a time to come to the patient's home? not applicable Were any new equipment or medical supplies ordered?  No What is the name of the medical supply agency? N/a Were you able to get the supplies/equipment? not applicable Do you have any questions related to the use of the equipment or supplies? No  Functional Questionnaire: (I = Independent and D = Dependent) ADLs: I  Bathing/Dressing- I  Meal Prep- I  Eating- I  Maintaining continence- I  Transferring/Ambulation- I  Managing Meds- I  Follow up appointments reviewed:  PCP Hospital f/u appt confirmed?  [facility] f/u appt confirmed?  Pt is following up with cardiology soon   Are transportation arrangements needed? No  If their condition worsens, is the pt aware to call PCP or go to the Emergency Dept.? Yes Was the patient provided with contact information for the PCP's office or ED? Yes Was to pt encouraged to call back with questions or concerns? Yes

## 2022-11-26 ENCOUNTER — Encounter (HOSPITAL_COMMUNITY): Payer: Self-pay | Admitting: *Deleted

## 2023-01-27 ENCOUNTER — Ambulatory Visit: Payer: 59 | Admitting: Family Medicine

## 2023-01-27 DIAGNOSIS — Z23 Encounter for immunization: Secondary | ICD-10-CM

## 2023-02-01 ENCOUNTER — Ambulatory Visit (INDEPENDENT_AMBULATORY_CARE_PROVIDER_SITE_OTHER): Payer: 59 | Admitting: Family Medicine

## 2023-02-01 ENCOUNTER — Encounter: Payer: Self-pay | Admitting: Family Medicine

## 2023-02-01 VITALS — BP 135/82 | HR 57 | Ht 76.0 in | Wt 204.8 lb

## 2023-02-01 DIAGNOSIS — T462X5A Adverse effect of other antidysrhythmic drugs, initial encounter: Secondary | ICD-10-CM | POA: Diagnosis not present

## 2023-02-01 DIAGNOSIS — E0580 Other thyrotoxicosis without thyrotoxic crisis or storm: Secondary | ICD-10-CM

## 2023-02-01 DIAGNOSIS — Z23 Encounter for immunization: Secondary | ICD-10-CM

## 2023-02-01 DIAGNOSIS — I482 Chronic atrial fibrillation, unspecified: Secondary | ICD-10-CM | POA: Diagnosis not present

## 2023-02-01 DIAGNOSIS — R1030 Lower abdominal pain, unspecified: Secondary | ICD-10-CM

## 2023-02-01 LAB — COMPREHENSIVE METABOLIC PANEL
ALT: 20 U/L (ref 0–53)
AST: 13 U/L (ref 0–37)
Albumin: 4.3 g/dL (ref 3.5–5.2)
Alkaline Phosphatase: 31 U/L — ABNORMAL LOW (ref 39–117)
BUN: 14 mg/dL (ref 6–23)
CO2: 29 mEq/L (ref 19–32)
Calcium: 9.1 mg/dL (ref 8.4–10.5)
Chloride: 100 mEq/L (ref 96–112)
Creatinine, Ser: 0.85 mg/dL (ref 0.40–1.50)
GFR: 97.48 mL/min (ref >60.00)
Glucose, Bld: 63 mg/dL — ABNORMAL LOW (ref 70–99)
Potassium: 4.2 mEq/L (ref 3.5–5.1)
Sodium: 139 mEq/L (ref 135–145)
Total Bilirubin: 0.3 mg/dL (ref 0.2–1.2)
Total Protein: 6.3 g/dL (ref 6.0–8.3)

## 2023-02-01 LAB — CBC WITH DIFFERENTIAL/PLATELET
Basophils Absolute: 0.1 10*3/uL (ref 0.0–0.1)
Basophils Relative: 0.8 % (ref 0.0–3.0)
Eosinophils Absolute: 0.1 10*3/uL (ref 0.0–0.7)
Eosinophils Relative: 1.1 % (ref 0.0–5.0)
HCT: 40.8 % (ref 39.0–52.0)
Hemoglobin: 13.7 g/dL (ref 13.0–17.0)
Lymphocytes Relative: 33.7 % (ref 12.0–46.0)
Lymphs Abs: 2.5 10*3/uL (ref 0.7–4.0)
MCHC: 33.5 g/dL (ref 30.0–36.0)
MCV: 97.6 fl (ref 78.0–100.0)
Monocytes Absolute: 0.9 10*3/uL (ref 0.1–1.0)
Monocytes Relative: 12.5 % — ABNORMAL HIGH (ref 3.0–12.0)
Neutro Abs: 3.8 10*3/uL (ref 1.4–7.7)
Neutrophils Relative %: 51.9 % (ref 43.0–77.0)
Platelets: 242 10*3/uL (ref 150.0–400.0)
RBC: 4.18 Mil/uL — ABNORMAL LOW (ref 4.22–5.81)
RDW: 12.9 % (ref 11.5–15.5)
WBC: 7.3 10*3/uL (ref 4.0–10.5)

## 2023-02-01 LAB — TSH: TSH: 1.65 u[IU]/mL (ref 0.35–5.50)

## 2023-02-01 LAB — T4, FREE: Free T4: 1.2 ng/dL (ref 0.60–1.60)

## 2023-02-01 NOTE — Progress Notes (Unsigned)
OFFICE VISIT  02/02/2023  CC:  Chief Complaint  Patient presents with   Abdominal Pain    Left side  Onset a few months  Gradually getting worse     Patient is a 57 y.o. male who presents for abdominal pain.  HPI: He has had about 2 months of gradually progressive lower abdominal pain.  Mostly diffuse but occasionally more on the left.  It is present daily lately.  No clear pattern or relation to food or medication. No blood or mucus in stool.  He has a BM every 1 to 3 days and feels like it is hard occasionally but says he is not feeling significantly constipated per se. No nausea.  His appetite is good.  No fever.  He does have increased flatulence.  He has no diarrhea.  ROS as above, plus--> no fevers, no CP, no SOB, no wheezing, no cough, no dizziness, no HAs, no rashes, no melena/hematochezia.  No polyuria or polydipsia.  No myalgias or arthralgias.  No focal weakness, paresthesias, or tremors.  No acute vision or hearing abnormalities.  No dysuria or unusual/new urinary urgency or frequency.  No recent changes in lower legs.   No palpitations.    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 Allyson Sabal referred him to a-fib clinic for consideration or redo CV or ablation. After eval by a-fib clinic, he was referred to [facility] to adult congenital specialist and EP: amiodarone started, cMRI to be repeated.   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  Cardiac MRI done in the [place] 04/2018 showed congenitally corrected transposition of the great arteries.  Also Systemic RV EF 41%, Mild systemic AR and mild/mod systemic AV valve regurg.  Normal fxn of subpulmonic LV.   History of adenomatous polyp of colon 09/2019   recall 09/2024   History of kidney stones    passed stone - no surgery   History of pneumonia 10/2017   Hypertension    Migraines    occasional   Recurrent  abdominal pain 2020   RLQ; blood and imaging eval unremarkable for cause, colonoscopy showed adenoma x 1, otherwise normal    Past Surgical History:  Procedure Laterality Date   Cardiac MRI  06/2019   EF 55%   CARDIAC SURGERY     correction of transposition of the great arteries noted on cardiac MRI done in the [place]., pt denies heart surgery   CARDIOVERSION  03/2018   pt only briefly held sinus rhythm in [place]   CARDIOVERSION N/A 01/12/2019   Procedure: CARDIOVERSION;  Surgeon: Thurmon Fair, MD;  Location: [facility];  Service: Cardiovascular;  Laterality: N/A;   COLONOSCOPY W/ POLYPECTOMY  09/2019   adenoma->recall 09/2024   TRANSTHORACIC ECHOCARDIOGRAM     in [place]-->pt cannot recall any results.   TRANSTHORACIC ECHOCARDIOGRAM  09/2018   EF for both ventricles 25%, severe systemic AV (tricuspid) regurg--> [facility] clinic. 05/2022 mod red RV (systemic) and LV function, mod AR   WISDOM TOOTH EXTRACTION      Outpatient Medications Prior to Visit  Medication Sig Dispense Refill   amiodarone (PACERONE) 200 MG tablet Take 200 mg by mouth daily.     bisoprolol (ZEBETA) 10 MG tablet Take 1 tablet (10 mg total) by mouth 2 (two) times daily. 60 tablet 4   Multiple Vitamin (MULTI-VITAMIN) tablet Take 1 tablet by mouth  daily.     predniSONE (DELTASONE) 10 MG tablet Take 10 mg by mouth daily with breakfast.     sacubitril-valsartan (ENTRESTO) 24-26 MG Take 1 tablet by mouth 2 (two) times daily.     spironolactone (ALDACTONE) 25 MG tablet Take 12.5 mg by mouth daily.     XARELTO 20 MG TABS tablet Take 20 mg by mouth daily.     methimazole (TAPAZOLE) 10 MG tablet Take 1 tablet (10 mg total) by mouth daily. 30 tablet 4   No facility-administered medications prior to visit.    No Known Allergies  Review of Systems  As per HPI  PE:    02/01/2023   11:05 AM 06/23/2022    2:31 PM 05/30/2022    7:21 AM  Vitals with BMI  Height 6\' 4"  6' 4.75"   Weight 204 lbs 13 oz 194 lbs   BMI 24.94  23.17   Systolic 135 122 353  Diastolic 82 80 67  Pulse 57 128 86     Physical Exam  Gen: Alert, well appearing.  Patient is oriented to person, place, time, and situation. AFFECT: pleasant, lucid thought and speech. CV: RRR, no m/r/g.   LUNGS: CTA bilat, nonlabored resps, good aeration in all lung fields. ABD: soft, mild discomfort to palpation diffusely in the lower abdomen without guarding or rebound.  ND, BS normal.  No hepatospenomegaly or mass.  No bruits. EXT: no clubbing or cyanosis.  no edema.    LABS:  Last CBC Lab Results  Component Value Date   WBC 7.3 02/01/2023   HGB 13.7 02/01/2023   HCT 40.8 02/01/2023   MCV 97.6 02/01/2023   MCH 31.0 05/26/2022   RDW 12.9 02/01/2023   PLT 242.0 02/01/2023   Last metabolic panel Lab Results  Component Value Date   GLUCOSE 63 (L) 02/01/2023   NA 139 02/01/2023   K 4.2 02/01/2023   CL 100 02/01/2023   CO2 29 02/01/2023   BUN 14 02/01/2023   CREATININE 0.85 02/01/2023   GFRNONAA >60 05/30/2022   CALCIUM 9.1 02/01/2023   PROT 6.3 02/01/2023   ALBUMIN 4.3 02/01/2023   BILITOT 0.3 02/01/2023   ALKPHOS 31 (L) 02/01/2023   AST 13 02/01/2023   ALT 20 02/01/2023   ANIONGAP 6 05/30/2022   Last lipids Lab Results  Component Value Date   CHOL 136 05/21/2022   HDL 49.60 05/21/2022   LDLCALC 66 05/21/2022   TRIG 101.0 05/21/2022   CHOLHDL 3 05/21/2022   Last thyroid functions Lab Results  Component Value Date   TSH 1.65 02/01/2023   T3TOTAL 74 (L) 02/01/2023   IMPRESSION AND PLAN:  #1 progressive lower abdominal pain of unclear etiology. Check CBC and c-Met today. Proceed with CT abdomen pelvis with contrast. Additionally, will refer to gastroenterology.  2.  Amiodarone-induced hyperthyroidism. He is followed by endocrinology and is maintained on prednisone 10 mg a day.  He was on methimazole but weaned off. He has follow-up with them soon and requests thyroid panel today--- ordered. At cardiology follow-up  on 01/05/2023 his TSH was 1.57 and his free T4 was 1.3.  3. atrial fibrillation. Asymptomatic on amiodarone 200 mg a day, bisoprolol 10 mg twice daily. Continue Xarelto 20 mg/day.  He has appropriate cardiology follow-up.  4.  Chronic congestive heart failure, history of repair of congenital heart defect. Followed by [facility]. Feeling well on Entresto 24-26, 1 tab twice a day, bisoprolol 10 mg twice a day, and spironolactone 12.5 mg daily. Labs from  [facility] visit on 01/05/2023 reviewed today: CBC normal, complete metabolic panel normal.  5.  Preventative health care. Shingrix #2 today.  An After Visit Summary was printed and given to the patient.  FOLLOW UP: Return in about 4 weeks (around 03/01/2023) for f/u abd pain. Next cpe 05/2023  Signed:  Santiago Bumpers, MD           02/02/2023

## 2023-02-02 LAB — T3: T3, Total: 74 ng/dL — ABNORMAL LOW (ref 76–181)

## 2023-02-03 ENCOUNTER — Encounter: Payer: Self-pay | Admitting: Physician Assistant

## 2023-03-01 ENCOUNTER — Ambulatory Visit: Payer: 59 | Admitting: Family Medicine

## 2023-03-03 ENCOUNTER — Ambulatory Visit
Admission: RE | Admit: 2023-03-03 | Discharge: 2023-03-03 | Disposition: A | Discharge status: Home / Self Care | Payer: 59 | Source: Ambulatory Visit | Attending: Family Medicine | Admitting: Family Medicine

## 2023-03-03 DIAGNOSIS — R1030 Lower abdominal pain, unspecified: Secondary | ICD-10-CM

## 2023-03-03 MED ORDER — IOPAMIDOL (ISOVUE-300) INJECTION 61%
100.0000 mL | Freq: Once | INTRAVENOUS | Status: AC | PRN
  Administered 2023-03-03: 100 mL via INTRAVENOUS

## 2023-03-05 ENCOUNTER — Telehealth: Payer: Self-pay

## 2023-03-05 ENCOUNTER — Encounter: Payer: Self-pay | Admitting: Family Medicine

## 2023-03-05 DIAGNOSIS — N3289 Other specified disorders of bladder: Secondary | ICD-10-CM

## 2023-03-05 NOTE — Telephone Encounter (Signed)
-----   Message from Jeoffrey Massed, MD sent at 03/05/2023  9:32 AM EDT ----- Please notify patient that his CT scan was all normal except he has some mild thickening of the wall of the urinary bladder.  This sometimes means that there is chronic inflammation in the bladder. Have him come in for lab visit to give a urine sample for clean-catch urinalysis and send for culture, diagnosis is bladder wall thickening.

## 2023-03-05 NOTE — Telephone Encounter (Signed)
Patient is aware of results and recommendations. He is scheduled for a lab visit 5/20.

## 2023-03-05 NOTE — Telephone Encounter (Signed)
Pt had additional questions regarding results, advised pt we were doing an evaluation of his urine to confirm if there is anything further needed regarding bladder thickening. Pt voiced understanding.

## 2023-03-05 NOTE — Telephone Encounter (Signed)
LM for pt to return call regarding results. Orders placed. Pt will need lab visit scheduled.

## 2023-03-08 ENCOUNTER — Other Ambulatory Visit (INDEPENDENT_AMBULATORY_CARE_PROVIDER_SITE_OTHER): Payer: 59

## 2023-03-08 DIAGNOSIS — N3289 Other specified disorders of bladder: Secondary | ICD-10-CM

## 2023-03-08 LAB — POCT URINALYSIS DIPSTICK
Bilirubin, UA: NEGATIVE
Glucose, UA: NEGATIVE
Ketones, UA: NEGATIVE
Leukocytes, UA: NEGATIVE
Nitrite, UA: NEGATIVE
Protein, UA: NEGATIVE
Spec Grav, UA: 1.025 (ref 1.010–1.025)
Urobilinogen, UA: 0.2 E.U./dL
pH, UA: 6 (ref 5.0–8.0)

## 2023-03-09 ENCOUNTER — Other Ambulatory Visit: Payer: Self-pay | Admitting: Family Medicine

## 2023-03-09 ENCOUNTER — Encounter: Payer: Self-pay | Admitting: Family Medicine

## 2023-03-09 DIAGNOSIS — N3289 Other specified disorders of bladder: Secondary | ICD-10-CM

## 2023-03-09 DIAGNOSIS — R3129 Other microscopic hematuria: Secondary | ICD-10-CM

## 2023-03-09 LAB — URINE CULTURE
MICRO NUMBER:: 14978572
Result:: NO GROWTH
SPECIMEN QUALITY:: ADEQUATE

## 2023-03-26 ENCOUNTER — Encounter: Payer: Self-pay | Admitting: Urology

## 2023-03-26 ENCOUNTER — Ambulatory Visit (INDEPENDENT_AMBULATORY_CARE_PROVIDER_SITE_OTHER): Payer: 59 | Admitting: Urology

## 2023-03-26 VITALS — BP 154/79 | HR 73 | Ht 75.98 in | Wt 200.6 lb

## 2023-03-26 DIAGNOSIS — R319 Hematuria, unspecified: Secondary | ICD-10-CM | POA: Diagnosis not present

## 2023-03-26 DIAGNOSIS — N3289 Other specified disorders of bladder: Secondary | ICD-10-CM

## 2023-03-26 NOTE — Progress Notes (Signed)
Assessment: 1. Hematuria, unspecified type   2. Bladder wall thickening     Plan: I personally reviewed the patient's chart including provider notes, lab and imaging results. I personally viewed the CT studies from 03/08/2023 and 12/28/18 with results as noted below. The the CT findings are nonspecific for any bladder abnormality.  The stability over a 4-year period of time suggests a benign process. He is unable to provide a urine sample today which is obviously necessary for evaluation of his hematuria. I discussed further evaluation of microscopic hematuria with cystoscopy. He will bring a urine sample to the office next week for evaluation.  I will contact him with results.   Chief Complaint:  Chief Complaint  Patient presents with   Hematuria    History of Present Illness:  Robert Braun is a 57 y.o. male who is seen in consultation from Jeoffrey Massed, MD for evaluation of bladder wall thickening on CT imaging and hematuria. He reports a episode of lower abdominal pain approximately 2 months ago.  This was intermittent and fairly sharp in nature.  No dysuria or gross hematuria.  No flank pain. CT from 03/04/2023 showed mild diffuse bladder wall thickening, stable in comparison to study from 3/20. Dipstick urinalysis from 03/08/2023 showed 2+ blood.  No microscopic urinalysis has been performed. Urine culture showed no growth.  He is not having any lower abdominal pain at the present time.  His only lower urinary tract symptom is nocturia 1-2 times.  He reports a good stream and feels like he empties his bladder well. IPSS = 5 today.  PSA from 8/23: 0.51  CT from 311/20 showed mild circumferential bladder wall thickening.  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 Allyson Sabal referred him to a-fib clinic for consideration or redo CV or  ablation. After eval by a-fib clinic, he was referred to [facility] to adult congenital specialist and EP: amiodarone started, cMRI to be repeated.   Bladder wall thickening    Diffuse (CT 2021 and 2024)-->microhematuria 02/2023-->?cystitis->referred to urol.   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  Cardiac MRI done in the [place] 04/2018 showed congenitally corrected transposition of the great arteries.  Also Systemic RV EF 41%, Mild systemic AR and mild/mod systemic AV valve regurg.  Normal fxn of subpulmonic LV.   History of adenomatous polyp of colon 09/2019   recall 09/2024   History of kidney stones    passed stone - no surgery   History of pneumonia 10/2017   Hypertension    Migraines    occasional   Recurrent abdominal pain 2020   RLQ; blood and imaging eval unremarkable for cause, colonoscopy showed adenoma x 1, otherwise normal    Past Surgical History:  Past Surgical History:  Procedure Laterality Date   Cardiac MRI  06/2019   EF 55%   CARDIAC SURGERY     correction of transposition of the great arteries noted on cardiac MRI done in the [place]., pt denies heart surgery   CARDIOVERSION  03/2018   pt only briefly held sinus rhythm in [place]   CARDIOVERSION N/A 01/12/2019   Procedure: CARDIOVERSION;  Surgeon: Thurmon Fair, MD;  Location: [facility];  Service: Cardiovascular;  Laterality: N/A;   COLONOSCOPY W/ POLYPECTOMY  09/2019   adenoma->recall 09/2024   TRANSTHORACIC ECHOCARDIOGRAM  in the [place]-->pt cannot recall any results.   TRANSTHORACIC ECHOCARDIOGRAM  09/2018   EF for both ventricles 25%, severe systemic AV (tricuspid) regurg--> [facility]. 05/2022 mod red RV (systemic) and LV function, mod AR   WISDOM TOOTH EXTRACTION      Allergies:  No Known Allergies  Family History:  Family History  Problem Relation Age of Onset   Parkinson's disease Mother    Hypertension Father    Colon cancer Neg Hx    Esophageal cancer Neg Hx    Rectal cancer  Neg Hx    Stomach cancer Neg Hx     Social History:  Social History   Tobacco Use   Smoking status: Former    Years: 31    Types: Cigarettes    Quit date: 11/19/2013    Years since quitting: 9.3   Smokeless tobacco: Never  Vaping Use   Vaping Use: Every day  Substance Use Topics   Alcohol use: Yes    Alcohol/week: 4.0 - 8.0 standard drinks of alcohol    Types: 4 - 8 Glasses of wine per week    Comment: 1-2 bottles wine on Weekends (Fri-Sun)   Drug use: Never    Review of symptoms:  Constitutional:  Negative for unexplained weight loss, night sweats, fever, chills ENT:  Negative for nose bleeds, sinus pain, painful swallowing CV:  Negative for chest pain, shortness of breath, exercise intolerance, palpitations, loss of consciousness Resp:  Negative for cough, wheezing, shortness of breath GI:  Negative for nausea, vomiting, diarrhea, bloody stools GU:  Positives noted in HPI; otherwise negative for gross hematuria, dysuria, urinary incontinence Neuro:  Negative for seizures, poor balance, limb weakness, slurred speech Psych:  Negative for lack of energy, depression, anxiety Endocrine:  Negative for polydipsia, polyuria, symptoms of hypoglycemia (dizziness, hunger, sweating) Hematologic:  Negative for anemia, purpura, petechia, prolonged or excessive bleeding, use of anticoagulants  Allergic:  Negative for difficulty breathing or choking as a result of exposure to anything; no shellfish allergy; no allergic response (rash/itch) to materials, foods  Physical exam: BP (!) 154/79   Pulse 73   Ht 6' 3.98" (1.93 m)   Wt 200 lb 9.9 oz (91 kg)   BMI 24.43 kg/m  GENERAL APPEARANCE:  Well appearing, well developed, well nourished, NAD HEENT: Atraumatic, Normocephalic, oropharynx clear. NECK: Supple without lymphadenopathy or thyromegaly. LUNGS: Clear to auscultation bilaterally. HEART: Regular Rate and Rhythm without murmurs, gallops, or rubs. ABDOMEN: Soft, non-tender, No  Masses. EXTREMITIES: Moves all extremities well.  Without clubbing, cyanosis, or edema. NEUROLOGIC:  Alert and oriented x 3, normal gait, CN II-XII grossly intact.  MENTAL STATUS:  Appropriate. BACK:  Non-tender to palpation.  No CVAT SKIN:  Warm, dry and intact.   GU: Penis:  uncircumcised Meatus: Normal Scrotum: normal, no masses Testis: normal without masses bilateral Epididymis: normal Prostate: 30 g, NT, no nodules Rectum: Normal tone,  no masses or tenderness   Results: No specimen provided

## 2023-03-29 ENCOUNTER — Other Ambulatory Visit: Payer: 59

## 2023-03-29 ENCOUNTER — Other Ambulatory Visit: Payer: Self-pay

## 2023-03-29 DIAGNOSIS — R319 Hematuria, unspecified: Secondary | ICD-10-CM

## 2023-04-01 ENCOUNTER — Ambulatory Visit: Payer: 59 | Admitting: Physician Assistant

## 2023-04-05 ENCOUNTER — Ambulatory Visit: Payer: 59 | Admitting: Family Medicine

## 2023-04-12 LAB — URINALYSIS, ROUTINE W REFLEX MICROSCOPIC
Bilirubin, UA: NEGATIVE
Glucose, UA: NEGATIVE
Ketones, UA: NEGATIVE
Leukocytes,UA: NEGATIVE
Nitrite, UA: NEGATIVE
Protein,UA: NEGATIVE
RBC, UA: NEGATIVE
Specific Gravity, UA: 1.015 (ref 1.005–1.030)
Urobilinogen, Ur: 0.2 mg/dL (ref 0.2–1.0)
pH, UA: 7 (ref 5.0–7.5)

## 2023-04-19 ENCOUNTER — Encounter: Payer: Self-pay | Admitting: Family Medicine
